# Patient Record
Sex: Female | Born: 1985 | Race: White | Hispanic: No | Marital: Single | Smoking: Current every day smoker
Health system: Southern US, Community
[De-identification: ages and names within clinical notes are randomized; demographics above are authoritative.]

## PROBLEM LIST (undated history)

## (undated) DIAGNOSIS — S36039A Unspecified laceration of spleen, initial encounter: Secondary | ICD-10-CM

## (undated) DIAGNOSIS — E876 Hypokalemia: Secondary | ICD-10-CM

## (undated) DIAGNOSIS — D649 Anemia, unspecified: Secondary | ICD-10-CM

## (undated) DIAGNOSIS — F419 Anxiety disorder, unspecified: Secondary | ICD-10-CM

## (undated) DIAGNOSIS — S270XXA Traumatic pneumothorax, initial encounter: Secondary | ICD-10-CM

## (undated) DIAGNOSIS — M549 Dorsalgia, unspecified: Secondary | ICD-10-CM

## (undated) DIAGNOSIS — Z5189 Encounter for other specified aftercare: Secondary | ICD-10-CM

## (undated) DIAGNOSIS — S36113A Laceration of liver, unspecified degree, initial encounter: Secondary | ICD-10-CM

## (undated) HISTORY — DX: Anemia, unspecified: D64.9

## (undated) HISTORY — PX: EAR TUBE REMOVAL: SHX1486

## (undated) HISTORY — DX: Encounter for other specified aftercare: Z51.89

## (undated) HISTORY — DX: Hypokalemia: E87.6

## (undated) HISTORY — PX: WISDOM TOOTH EXTRACTION: SHX21

## (undated) HISTORY — PX: ADENOIDECTOMY: SUR15

## (undated) HISTORY — PX: TONSILLECTOMY: SUR1361

---
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
## 2003-06-18 ENCOUNTER — Other Ambulatory Visit: Admission: RE | Admit: 2003-06-18 | Discharge: 2003-06-18 | Payer: Self-pay | Admitting: Family Medicine

## 2003-06-24 ENCOUNTER — Ambulatory Visit (HOSPITAL_COMMUNITY): Admission: RE | Admit: 2003-06-24 | Discharge: 2003-06-24 | Payer: Self-pay | Admitting: Family Medicine

## 2003-08-19 ENCOUNTER — Ambulatory Visit (HOSPITAL_COMMUNITY): Admission: RE | Admit: 2003-08-19 | Discharge: 2003-08-19 | Payer: Self-pay | Admitting: Family Medicine

## 2003-11-27 ENCOUNTER — Ambulatory Visit (HOSPITAL_COMMUNITY): Admission: RE | Admit: 2003-11-27 | Discharge: 2003-11-27 | Payer: Self-pay | Admitting: Family Medicine

## 2003-12-09 ENCOUNTER — Other Ambulatory Visit: Admission: RE | Admit: 2003-12-09 | Discharge: 2003-12-09 | Payer: Self-pay | Admitting: Family Medicine

## 2003-12-30 ENCOUNTER — Inpatient Hospital Stay (HOSPITAL_COMMUNITY): Admission: AD | Admit: 2003-12-30 | Discharge: 2004-01-02 | Payer: Self-pay | Admitting: Family Medicine

## 2003-12-30 ENCOUNTER — Ambulatory Visit: Payer: Self-pay | Admitting: Obstetrics and Gynecology

## 2004-02-24 ENCOUNTER — Other Ambulatory Visit: Admission: RE | Admit: 2004-02-24 | Discharge: 2004-02-24 | Payer: Self-pay | Admitting: Family Medicine

## 2008-12-10 ENCOUNTER — Emergency Department (HOSPITAL_COMMUNITY): Admission: EM | Admit: 2008-12-10 | Discharge: 2008-12-10 | Payer: Self-pay | Admitting: Emergency Medicine

## 2009-01-07 ENCOUNTER — Emergency Department (HOSPITAL_COMMUNITY): Admission: EM | Admit: 2009-01-07 | Discharge: 2009-01-07 | Payer: Self-pay | Admitting: Emergency Medicine

## 2009-04-03 ENCOUNTER — Emergency Department (HOSPITAL_COMMUNITY): Admission: EM | Admit: 2009-04-03 | Discharge: 2009-04-03 | Payer: Self-pay | Admitting: Emergency Medicine

## 2009-05-27 ENCOUNTER — Other Ambulatory Visit: Admission: RE | Admit: 2009-05-27 | Discharge: 2009-05-27 | Payer: Self-pay | Admitting: Obstetrics and Gynecology

## 2009-07-12 ENCOUNTER — Emergency Department (HOSPITAL_COMMUNITY): Admission: EM | Admit: 2009-07-12 | Discharge: 2009-07-12 | Payer: Self-pay | Admitting: Emergency Medicine

## 2009-10-10 ENCOUNTER — Ambulatory Visit: Payer: Self-pay | Admitting: Family Medicine

## 2009-10-10 ENCOUNTER — Inpatient Hospital Stay (HOSPITAL_COMMUNITY): Admission: RE | Admit: 2009-10-10 | Discharge: 2009-10-12 | Payer: Self-pay | Admitting: Obstetrics & Gynecology

## 2010-06-14 ENCOUNTER — Encounter: Payer: Self-pay | Admitting: Family Medicine

## 2010-08-10 LAB — CBC
HCT: 32.4 % — ABNORMAL LOW (ref 36.0–46.0)
HCT: 37.5 % (ref 36.0–46.0)
Hemoglobin: 13.1 g/dL (ref 12.0–15.0)
MCHC: 34.5 g/dL (ref 30.0–36.0)
MCHC: 34.9 g/dL (ref 30.0–36.0)
MCV: 93.7 fL (ref 78.0–100.0)
RBC: 3.46 MIL/uL — ABNORMAL LOW (ref 3.87–5.11)
RBC: 4.05 MIL/uL (ref 3.87–5.11)
WBC: 15.7 10*3/uL — ABNORMAL HIGH (ref 4.0–10.5)

## 2010-10-09 NOTE — Op Note (Signed)
NAME:  Patricia Jefferson, Patricia Jefferson                         ACCOUNT NO.:  [id number]   MEDICAL RECORD NO.:  [id number]                   PATIENT TYPE:  INP   LOCATION:  9163                                 FACILITY:  [facility]   PHYSICIAN:  Phil D. Okey Dupre, M.D.                  DATE OF BIRTH:  05/30/1985   DATE OF PROCEDURE:  12/30/2003  DATE OF DISCHARGE:                                 OPERATIVE REPORT   PREOPERATIVE DIAGNOSIS:  Maternal exhaustion after three hours of pushing,  second stage.   POSTOPERATIVE DIAGNOSIS:  Left occiput posterior presentation.   PROCEDURE:  Forceps delivery with vertex rotation.   OBSTETRICIAN:  Phil D. Okey Dupre, M.D.   ASSISTANT:  Magnus Sinning. Rice, M.D.   ANESTHESIA:  Epidural.   ESTIMATED BLOOD LOSS:  600 mL.   OPERATIVE FINDINGS:  I was called to see the patient by Dr. Dimple Casey because of  a posterior arrest of the patient with the baby at a +3 presentation and LOP  position.  The baby had had some variable decelerations during the night and  a three-hour second stage of labor.  During examination I was able to rotate  the baby digitally to an LOT presentation, at which point the Luikart-McLean  forceps were put on.  The anterior blade wandered over the baby's face to a  position just under the bladder.  The second bladder was put out from the  patient's left and placed over the right side of the face.  Once this was  done, the baby was rotated to an LOA presentation and with the __________  extraction, the baby was very easily delivered over a midline episiotomy  that was carried down to the __________ muscle.  As the baby's head was  crowning, the forceps were removed.  The baby had a nuchal cord, which was  manually reduced, and the baby easily delivered then by Dr. Dimple Casey, cord  doubly clamped, divided, the baby taken to the [facility].  Cord samples were  taken of blood for analysis and the placenta spontaneously removed.  Vaginal  exam revealed the midline episiotomy  with no significant extension and a  small right sulcus tear of approximately 2 x 1.5 cm, which was controlled  with a 2-0 Vicryl figure-of-eight suture.  At that point the episiotomy  using the same suture was repaired in the usual method.  The baby will be  evaluated by the nurse.                                               Phil D. Okey Dupre, M.D.    PDR/MEDQ  D:  12/31/2003  T:  12/31/2003  Job:  [id number]

## 2010-10-09 NOTE — Op Note (Signed)
NAME:  Patricia Jefferson, Patricia Jefferson                         ACCOUNT NO.:  [id number]   MEDICAL RECORD NO.:  [id number]                   PATIENT TYPE:  INP   LOCATION:  9163                                 [facility]ILITY[facility]   PHYSICIAN:  Phil D. Okey Dupre, M.D.                  DATE OF BIRTH:  [birth year]   DATE OF PROCEDURE:  12/31/2003  DATE OF DISCHARGE:                                 OPERATIVE REPORT   DELIVERY NOTE:  Forceps delivery with a vertex rotation.   DICTATION ENDED HERE.                                               Phil D. Okey Dupre, M.D.    PDR/MEDQ  D:  12/31/2003  T:  12/31/2003  Job:  [id number]

## 2014-05-07 ENCOUNTER — Emergency Department (HOSPITAL_COMMUNITY): Payer: BC Managed Care – PPO

## 2014-05-07 ENCOUNTER — Emergency Department (HOSPITAL_COMMUNITY)
Admission: EM | Admit: 2014-05-07 | Discharge: 2014-05-07 | Disposition: A | Discharge status: Home / Self Care | Payer: BC Managed Care – PPO | Attending: Emergency Medicine | Admitting: Emergency Medicine

## 2014-05-07 ENCOUNTER — Encounter (HOSPITAL_COMMUNITY): Payer: Self-pay | Admitting: Emergency Medicine

## 2014-05-07 DIAGNOSIS — J069 Acute upper respiratory infection, unspecified: Secondary | ICD-10-CM | POA: Insufficient documentation

## 2014-05-07 DIAGNOSIS — Z8659 Personal history of other mental and behavioral disorders: Secondary | ICD-10-CM | POA: Insufficient documentation

## 2014-05-07 DIAGNOSIS — Z720 Tobacco use: Secondary | ICD-10-CM | POA: Diagnosis not present

## 2014-05-07 DIAGNOSIS — R05 Cough: Secondary | ICD-10-CM

## 2014-05-07 DIAGNOSIS — R0981 Nasal congestion: Secondary | ICD-10-CM | POA: Diagnosis present

## 2014-05-07 DIAGNOSIS — R059 Cough, unspecified: Secondary | ICD-10-CM

## 2014-05-07 HISTORY — DX: Anxiety disorder, unspecified: F41.9

## 2014-05-07 HISTORY — DX: Dorsalgia, unspecified: M54.9

## 2014-05-07 MED ORDER — BENZONATATE 100 MG PO CAPS: 100.0000 mg | ORAL_CAPSULE | Freq: Three times a day (TID) | ORAL | Status: DC

## 2014-05-07 MED ORDER — HYDROCODONE-ACETAMINOPHEN 7.5-325 MG/15ML PO SOLN: 15.0000 mL | Freq: Three times a day (TID) | ORAL | Status: DC | PRN

## 2014-05-07 NOTE — Discharge Instructions (Signed)
Please call your doctor for a followup appointment within 24-48 hours. When you talk to your doctor please let them know that you were seen in the emergency department and have them acquire all of your records so that they can discuss the findings with you and formulate a treatment plan to fully care for your new and ongoing problems. ° °[facility] Doctor List ° ° ° °Edward Hawkins MD. Specialty: Pulmonary Disease Contact information: [address]  °[address]  °[place] [address]  °[phone]  ° °Margaret Simpson, MD. Specialty: Family Medicine Contact information: [address]  °[place] [address]  °[phone]  ° °Scott Luking, MD. Specialty: Family Medicine Contact information: [address]  °[address]  °[place] [address]  °[phone]  ° °Tesfaye Fanta, MD Specialty: Internal Medicine Contact information: [address]  °[place] [address]  °[phone]  ° °Zach Hall, MD. Specialty: Internal Medicine Contact information: [address]  °[place] [address]  °[phone]  ° °Angus Mcinnis, MD. Specialty: Family Medicine Contact information: [address]  °[place] [address]  °[phone]  ° °Stephen Knowlton, MD. Specialty: Family Medicine Contact information: [address]  °[address]  °[place] [address]  °[phone]  ° °Roy Fagan, MD. Specialty: Internal Medicine Contact information: [address]  °[address]  °[place] [address]  °[phone]  ° ° °

## 2014-05-07 NOTE — ED Provider Notes (Signed)
CSN: [id number]     Arrival date & time 05/07/14  [id number] History  This chart was scribed for Vida RollerBrian D Zan Triska, MD by Tonye RoyaltyJoshua Chen, ED Scribe. This patient was seen in room APA10/APA10 and the patient's care was started at 9:41 AM.   Chief Complaint  Patient presents with  . URI    The history is provided by the patient. No language interpreter was used.    Symptoms were gradual in onset Symptoms are persistent Symptoms are improved with Robitussin, Sudafed Made worse with nothing Associated symptoms include jaw pain, ear pain    HPI Comments: Patricia Jefferson is a 28 y.o. female who is a smoker who presents to the Emergency Department complaining of congestion, sore throat, and a dry cough with onset 3 days ago. Symptoms have gradually worsened. She reports associated jaw pain and ear pain. She states her cough does is dry but feels like there is "phlegm rattling around." Patient has taken sudafed and Robitussin.   Past Medical History  Diagnosis Date  . Anxiety   . Back pain    Past Surgical History  Procedure Laterality Date  . Tonsillectomy    . Adenoidectomy    . Ear tube removal     No family history on file. History  Substance Use Topics  . Smoking status: Current Every Day Smoker -- 0.50 packs/day    Types: Cigarettes  . Smokeless tobacco: Not on file  . Alcohol Use: Yes     Comment: socially   OB History    No data available     Review of Systems  HENT: Positive for congestion, ear pain and sore throat.   Respiratory: Positive for cough.       Allergies  Review of patient's allergies indicates no known allergies.  Home Medications   Prior to Admission medications   Medication Sig Start Date End Date Taking? Authorizing Provider  benzonatate (TESSALON) 100 MG capsule Take 1 capsule (100 mg total) by mouth every 8 (eight) hours. 05/07/14   Vida RollerBrian D Karynn Deblasi, MD  HYDROcodone-acetaminophen (HYCET) 7.5-325 mg/15 ml solution Take 15 mLs by mouth every 8 (eight)  hours as needed for moderate pain. 05/07/14   Vida RollerBrian D Elihue Ebert, MD   BP 112/73 mmHg  Pulse 92  Temp(Src) 98 F (36.7 C) (Oral)  Resp 18  Ht 5\' 7"  (1.702 m)  Wt 120 lb (54.432 kg)  BMI 18.79 kg/m2  SpO2 98%  LMP 04/13/2014 Physical Exam  Constitutional: She appears well-developed and well-nourished. No distress.  HENT:  Head: Normocephalic and atraumatic.  Mouth/Throat: Oropharynx is clear and moist. No oropharyngeal exudate.  Tonsils surgically absent  Tympanic membranes are normal   Eyes: Conjunctivae and EOM are normal. Pupils are equal, round, and reactive to light. Right eye exhibits no discharge. Left eye exhibits no discharge. No scleral icterus.  Neck: Normal range of motion. Neck supple. No JVD present. No thyromegaly present.  Cardiovascular: Normal rate, regular rhythm, normal heart sounds and intact distal pulses.  Exam reveals no gallop and no friction rub.   No murmur heard. Tachycardic at 105  Pulmonary/Chest: Effort normal and breath sounds normal. No respiratory distress. She has no wheezes. She has no rales.  No increased work of breathing  Abdominal: Soft. Bowel sounds are normal. She exhibits no distension and no mass. There is no tenderness.  Musculoskeletal: Normal range of motion. She exhibits no edema or tenderness.  Lymphadenopathy:    She has cervical adenopathy (Lymph of anterior cervcal chain ).  Neurological: She is alert. Coordination normal.  Skin: Skin is warm and dry. No rash noted. No erythema.  Psychiatric: She has a normal mood and affect. Her behavior is normal.  Nursing note and vitals reviewed.   ED Course  Procedures (including critical care time)  DIAGNOSTIC STUDIES: Oxygen Saturation is 100% on room air, normal by my interpretation.    COORDINATION OF CARE: 9:44 AM Discussed treatment plan with patient at beside, the patient agrees with the plan and has no further questions at this time.   Labs Review Labs Reviewed - No data to  display  Imaging Review Dg Chest 2 View  05/07/2014   CLINICAL DATA:  Cough, shortness of breath starting Friday  EXAM: CHEST  2 VIEW  COMPARISON:  None.  FINDINGS: Cardiomediastinal silhouette is unremarkable. No acute infiltrate or pleural effusion. No pulmonary edema. Bilateral nipple metallic artifacts. Bony thorax is unremarkable.  IMPRESSION: No active cardiopulmonary disease.   Electronically Signed   By: Natasha MeadLiviu  Pop M.D.   On: 05/07/2014 09:00      MDM   Final diagnoses:  URI (upper respiratory infection)    The pt is well appearing, dry bronchitic cough with normal VS on d/c.  She has no hypoxia and no signs of strep, xray as ordered by nursing shows no signs of infiltrate.  Meds given in ED:  Medications - No data to display  Discharge Medication List as of 05/07/2014  9:50 AM    START taking these medications   Details  benzonatate (TESSALON) 100 MG capsule Take 1 capsule (100 mg total) by mouth every 8 (eight) hours., Starting 05/07/2014, Until Discontinued, Print    HYDROcodone-acetaminophen (HYCET) 7.5-325 mg/15 ml solution Take 15 mLs by mouth every 8 (eight) hours as needed for moderate pain., Starting 05/07/2014, Until Discontinued, Print         I personally performed the services described in this documentation, which was scribed in my presence. The recorded information has been reviewed and is accurate.    Vida RollerBrian D Tamila Gaulin, MD 05/07/14 [phone]

## 2014-05-07 NOTE — ED Notes (Signed)
PT c/o nasal congestion, sore throat and dry cough x3 days. PT denies any fever or SOB.

## 2016-02-16 ENCOUNTER — Encounter (HOSPITAL_COMMUNITY): Payer: Self-pay | Admitting: Emergency Medicine

## 2016-02-16 ENCOUNTER — Emergency Department (HOSPITAL_COMMUNITY): Payer: BLUE CROSS/BLUE SHIELD | Admitting: Anesthesiology

## 2016-02-16 ENCOUNTER — Emergency Department (HOSPITAL_COMMUNITY): Payer: BLUE CROSS/BLUE SHIELD

## 2016-02-16 ENCOUNTER — Encounter (HOSPITAL_COMMUNITY): Admission: EM | Disposition: A | Discharge status: Home / Self Care | Payer: Self-pay | Source: Home / Self Care

## 2016-02-16 ENCOUNTER — Inpatient Hospital Stay (HOSPITAL_COMMUNITY): Payer: BLUE CROSS/BLUE SHIELD

## 2016-02-16 ENCOUNTER — Inpatient Hospital Stay (HOSPITAL_COMMUNITY)
Admission: EM | Admit: 2016-02-16 | Discharge: 2016-02-23 | DRG: 957 | Disposition: A | Discharge status: Home / Self Care | Payer: BLUE CROSS/BLUE SHIELD | Attending: General Surgery | Admitting: General Surgery

## 2016-02-16 DIAGNOSIS — S299XXA Unspecified injury of thorax, initial encounter: Secondary | ICD-10-CM

## 2016-02-16 DIAGNOSIS — R413 Other amnesia: Secondary | ICD-10-CM | POA: Diagnosis present

## 2016-02-16 DIAGNOSIS — J181 Lobar pneumonia, unspecified organism: Secondary | ICD-10-CM

## 2016-02-16 DIAGNOSIS — S060X9A Concussion with loss of consciousness of unspecified duration, initial encounter: Secondary | ICD-10-CM | POA: Diagnosis present

## 2016-02-16 DIAGNOSIS — R578 Other shock: Secondary | ICD-10-CM | POA: Diagnosis present

## 2016-02-16 DIAGNOSIS — F17210 Nicotine dependence, cigarettes, uncomplicated: Secondary | ICD-10-CM | POA: Diagnosis present

## 2016-02-16 DIAGNOSIS — F419 Anxiety disorder, unspecified: Secondary | ICD-10-CM | POA: Diagnosis present

## 2016-02-16 DIAGNOSIS — R6889 Other general symptoms and signs: Secondary | ICD-10-CM

## 2016-02-16 DIAGNOSIS — M549 Dorsalgia, unspecified: Secondary | ICD-10-CM | POA: Diagnosis present

## 2016-02-16 DIAGNOSIS — R000 Tachycardia, unspecified: Secondary | ICD-10-CM | POA: Diagnosis not present

## 2016-02-16 DIAGNOSIS — S27322A Contusion of lung, bilateral, initial encounter: Secondary | ICD-10-CM | POA: Diagnosis present

## 2016-02-16 DIAGNOSIS — Z9689 Presence of other specified functional implants: Secondary | ICD-10-CM

## 2016-02-16 DIAGNOSIS — S36113A Laceration of liver, unspecified degree, initial encounter: Secondary | ICD-10-CM | POA: Diagnosis present

## 2016-02-16 DIAGNOSIS — R0902 Hypoxemia: Secondary | ICD-10-CM

## 2016-02-16 DIAGNOSIS — K661 Hemoperitoneum: Secondary | ICD-10-CM | POA: Diagnosis present

## 2016-02-16 DIAGNOSIS — R0781 Pleurodynia: Secondary | ICD-10-CM

## 2016-02-16 DIAGNOSIS — S36039A Unspecified laceration of spleen, initial encounter: Secondary | ICD-10-CM

## 2016-02-16 DIAGNOSIS — E876 Hypokalemia: Secondary | ICD-10-CM | POA: Diagnosis present

## 2016-02-16 DIAGNOSIS — D62 Acute posthemorrhagic anemia: Secondary | ICD-10-CM | POA: Diagnosis present

## 2016-02-16 DIAGNOSIS — Z7409 Other reduced mobility: Secondary | ICD-10-CM

## 2016-02-16 DIAGNOSIS — J9691 Respiratory failure, unspecified with hypoxia: Secondary | ICD-10-CM | POA: Diagnosis not present

## 2016-02-16 DIAGNOSIS — R262 Difficulty in walking, not elsewhere classified: Secondary | ICD-10-CM

## 2016-02-16 DIAGNOSIS — E872 Acidosis, unspecified: Secondary | ICD-10-CM

## 2016-02-16 DIAGNOSIS — Z23 Encounter for immunization: Secondary | ICD-10-CM

## 2016-02-16 DIAGNOSIS — S3609XA Other injury of spleen, initial encounter: Principal | ICD-10-CM | POA: Diagnosis present

## 2016-02-16 DIAGNOSIS — S270XXA Traumatic pneumothorax, initial encounter: Secondary | ICD-10-CM | POA: Diagnosis present

## 2016-02-16 DIAGNOSIS — Z01818 Encounter for other preprocedural examination: Secondary | ICD-10-CM

## 2016-02-16 DIAGNOSIS — S36114A Minor laceration of liver, initial encounter: Secondary | ICD-10-CM

## 2016-02-16 DIAGNOSIS — S272XXA Traumatic hemopneumothorax, initial encounter: Secondary | ICD-10-CM

## 2016-02-16 DIAGNOSIS — J939 Pneumothorax, unspecified: Secondary | ICD-10-CM

## 2016-02-16 HISTORY — DX: Unspecified laceration of spleen, initial encounter: S36.039A

## 2016-02-16 HISTORY — PX: SPLENECTOMY, TOTAL: SHX788

## 2016-02-16 HISTORY — PX: CHEST TUBE INSERTION: SHX231

## 2016-02-16 HISTORY — PX: CHOLECYSTECTOMY: SHX55

## 2016-02-16 HISTORY — PX: LAPAROTOMY: SHX154

## 2016-02-16 LAB — BASIC METABOLIC PANEL
Anion gap: 8 (ref 5–15)
BUN: 6 mg/dL (ref 6–20)
CALCIUM: 6.8 mg/dL — AB (ref 8.9–10.3)
CHLORIDE: 109 mmol/L (ref 101–111)
CO2: 23 mmol/L (ref 22–32)
CREATININE: 0.6 mg/dL (ref 0.44–1.00)
GFR calc Af Amer: >60 mL/min (ref >60)
GFR calc non Af Amer: >60 mL/min (ref >60)
Glucose, Bld: 118 mg/dL — ABNORMAL HIGH (ref 65–99)
Potassium: 3.3 mmol/L — ABNORMAL LOW (ref 3.5–5.1)
SODIUM: 140 mmol/L (ref 135–145)

## 2016-02-16 LAB — CBC
HEMATOCRIT: 25.5 % — AB (ref 36.0–46.0)
HEMATOCRIT: 33.4 % — AB (ref 36.0–46.0)
HEMOGLOBIN: 11.2 g/dL — AB (ref 12.0–15.0)
Hemoglobin: 8.8 g/dL — ABNORMAL LOW (ref 12.0–15.0)
MCH: 33.6 pg (ref 26.0–34.0)
MCH: 31.5 pg (ref 26.0–34.0)
MCHC: 34.5 g/dL (ref 30.0–36.0)
MCHC: 33.5 g/dL (ref 30.0–36.0)
MCV: 97.3 fL (ref 78.0–100.0)
MCV: 94.1 fL (ref 78.0–100.0)
PLATELETS: 171 10*3/uL (ref 150–400)
Platelets: 123 10*3/uL — ABNORMAL LOW (ref 150–400)
RBC: 2.62 MIL/uL — ABNORMAL LOW (ref 3.87–5.11)
RBC: 3.55 MIL/uL — AB (ref 3.87–5.11)
RDW: 12.6 % (ref 11.5–15.5)
RDW: 15 % (ref 11.5–15.5)
WBC: 30.7 10*3/uL — AB (ref 4.0–10.5)
WBC: 22.4 10*3/uL — ABNORMAL HIGH (ref 4.0–10.5)

## 2016-02-16 LAB — COMPREHENSIVE METABOLIC PANEL
ALBUMIN: 2.7 g/dL — AB (ref 3.5–5.0)
ALT: 58 U/L — ABNORMAL HIGH (ref 14–54)
AST: 83 U/L — AB (ref 15–41)
Alkaline Phosphatase: 40 U/L (ref 38–126)
Anion gap: 7 (ref 5–15)
BUN: 6 mg/dL (ref 6–20)
CHLORIDE: 113 mmol/L — AB (ref 101–111)
CO2: 21 mmol/L — AB (ref 22–32)
Calcium: 6.5 mg/dL — ABNORMAL LOW (ref 8.9–10.3)
Creatinine, Ser: 0.62 mg/dL (ref 0.44–1.00)
GFR calc Af Amer: >60 mL/min (ref >60)
GFR calc non Af Amer: >60 mL/min (ref >60)
GLUCOSE: 124 mg/dL — AB (ref 65–99)
POTASSIUM: 3.1 mmol/L — AB (ref 3.5–5.1)
SODIUM: 141 mmol/L (ref 135–145)
Total Bilirubin: 0.4 mg/dL (ref 0.3–1.2)
Total Protein: 4.3 g/dL — ABNORMAL LOW (ref 6.5–8.1)

## 2016-02-16 LAB — PREPARE RBC (CROSSMATCH)

## 2016-02-16 LAB — URINALYSIS, ROUTINE W REFLEX MICROSCOPIC
BILIRUBIN URINE: NEGATIVE
Glucose, UA: NEGATIVE mg/dL
KETONES UR: NEGATIVE mg/dL
Leukocytes, UA: NEGATIVE
NITRITE: NEGATIVE
PH: 6 (ref 5.0–8.0)
Protein, ur: NEGATIVE mg/dL
SPECIFIC GRAVITY, URINE: 1.01 (ref 1.005–1.030)

## 2016-02-16 LAB — ABO/RH: ABO/RH(D): O POS

## 2016-02-16 LAB — I-STAT CHEM 8, ED
BUN: 4 mg/dL — AB (ref 6–20)
Calcium, Ion: 0.93 mmol/L — ABNORMAL LOW (ref 1.15–1.40)
Chloride: 107 mmol/L (ref 101–111)
Creatinine, Ser: 0.7 mg/dL (ref 0.44–1.00)
Glucose, Bld: 125 mg/dL — ABNORMAL HIGH (ref 65–99)
HEMATOCRIT: 25 % — AB (ref 36.0–46.0)
HEMOGLOBIN: 8.5 g/dL — AB (ref 12.0–15.0)
Potassium: 3.1 mmol/L — ABNORMAL LOW (ref 3.5–5.1)
SODIUM: 145 mmol/L (ref 135–145)
TCO2: 21 mmol/L (ref 0–100)

## 2016-02-16 LAB — I-STAT CG4 LACTIC ACID, ED: LACTIC ACID, VENOUS: 3.57 mmol/L — AB (ref 0.5–1.9)

## 2016-02-16 LAB — I-STAT BETA HCG BLOOD, ED (MC, WL, AP ONLY): I-stat hCG, quantitative: <5 m[IU]/mL (ref <5)

## 2016-02-16 LAB — PROTIME-INR
INR: 1.47
Prothrombin Time: 18 seconds — ABNORMAL HIGH (ref 11.4–15.2)

## 2016-02-16 LAB — SAMPLE TO BLOOD BANK

## 2016-02-16 LAB — URINE MICROSCOPIC-ADD ON

## 2016-02-16 LAB — ETHANOL: Alcohol, Ethyl (B): 57 mg/dL — ABNORMAL HIGH (ref <5)

## 2016-02-16 SURGERY — SPLENECTOMY
Anesthesia: General | Site: Chest

## 2016-02-16 MED ORDER — SODIUM CHLORIDE 0.9 % IV SOLN
INTRAVENOUS | Status: DC | PRN
  Administered 2016-02-16: 11:00:00 via INTRAVENOUS

## 2016-02-16 MED ORDER — FENTANYL CITRATE (PF) 100 MCG/2ML IJ SOLN
50.0000 ug | Freq: Once | INTRAMUSCULAR | Status: AC
  Administered 2016-02-16: 50 ug via INTRAVENOUS
  Filled 2016-02-16: qty 2

## 2016-02-16 MED ORDER — SODIUM CHLORIDE 0.9% FLUSH: 9.0000 mL | INTRAVENOUS | Status: DC | PRN

## 2016-02-16 MED ORDER — HYDROMORPHONE HCL 1 MG/ML IJ SOLN
INTRAMUSCULAR | Status: AC
  Administered 2016-02-16: 0.5 mg via INTRAVENOUS
  Filled 2016-02-16: qty 1

## 2016-02-16 MED ORDER — PANTOPRAZOLE SODIUM 40 MG PO TBEC
40.0000 mg | DELAYED_RELEASE_TABLET | Freq: Two times a day (BID) | ORAL | Status: DC
  Administered 2016-02-18 – 2016-02-23 (×5): 40 mg via ORAL
  Filled 2016-02-16 (×11): qty 1

## 2016-02-16 MED ORDER — DIPHENHYDRAMINE HCL 12.5 MG/5ML PO ELIX: 12.5000 mg | ORAL_SOLUTION | Freq: Four times a day (QID) | ORAL | Status: DC | PRN

## 2016-02-16 MED ORDER — ONDANSETRON HCL 4 MG/2ML IJ SOLN: 4.0000 mg | Freq: Four times a day (QID) | INTRAMUSCULAR | Status: DC | PRN

## 2016-02-16 MED ORDER — GLYCOPYRROLATE 0.2 MG/ML IJ SOLN
INTRAMUSCULAR | Status: DC | PRN
  Administered 2016-02-16: .6 mg via INTRAVENOUS

## 2016-02-16 MED ORDER — POTASSIUM CHLORIDE IN NACL 20-0.45 MEQ/L-% IV SOLN
INTRAVENOUS | Status: DC
  Administered 2016-02-16: 22:00:00 via INTRAVENOUS
  Administered 2016-02-17: 1000 mL via INTRAVENOUS
  Administered 2016-02-18: 18:00:00 via INTRAVENOUS
  Administered 2016-02-19: 1000 mL via INTRAVENOUS
  Administered 2016-02-21 – 2016-02-22 (×3): via INTRAVENOUS
  Filled 2016-02-16 (×15): qty 1000

## 2016-02-16 MED ORDER — ONDANSETRON HCL 4 MG/2ML IJ SOLN
INTRAMUSCULAR | Status: DC | PRN
  Administered 2016-02-16: 4 mg via INTRAVENOUS

## 2016-02-16 MED ORDER — FENTANYL CITRATE (PF) 100 MCG/2ML IJ SOLN
25.0000 ug | Freq: Once | INTRAMUSCULAR | Status: AC
  Administered 2016-02-16: 25 ug via INTRAVENOUS
  Filled 2016-02-16: qty 2

## 2016-02-16 MED ORDER — PROMETHAZINE HCL 25 MG/ML IJ SOLN: 6.2500 mg | INTRAMUSCULAR | Status: DC | PRN

## 2016-02-16 MED ORDER — LIDOCAINE HCL (CARDIAC) 20 MG/ML IV SOLN
INTRAVENOUS | Status: DC | PRN
  Administered 2016-02-16: 80 mg via INTRAVENOUS

## 2016-02-16 MED ORDER — 0.9 % SODIUM CHLORIDE (POUR BTL) OPTIME
TOPICAL | Status: DC | PRN
  Administered 2016-02-16: 5000 mL

## 2016-02-16 MED ORDER — NALOXONE HCL 0.4 MG/ML IJ SOLN: 0.4000 mg | INTRAMUSCULAR | Status: DC | PRN

## 2016-02-16 MED ORDER — FENTANYL CITRATE (PF) 100 MCG/2ML IJ SOLN
50.0000 ug | Freq: Once | INTRAMUSCULAR | Status: AC
  Administered 2016-02-16: 50 ug via INTRAVENOUS

## 2016-02-16 MED ORDER — SUCCINYLCHOLINE CHLORIDE 20 MG/ML IJ SOLN
INTRAMUSCULAR | Status: DC | PRN
  Administered 2016-02-16: 60 mg via INTRAVENOUS

## 2016-02-16 MED ORDER — MORPHINE SULFATE 2 MG/ML IV SOLN
INTRAVENOUS | Status: AC
  Filled 2016-02-16: qty 25

## 2016-02-16 MED ORDER — ROCURONIUM BROMIDE 100 MG/10ML IV SOLN
INTRAVENOUS | Status: DC | PRN
  Administered 2016-02-16: 20 mg via INTRAVENOUS
  Administered 2016-02-16: 50 mg via INTRAVENOUS

## 2016-02-16 MED ORDER — FAMOTIDINE IN NACL 20-0.9 MG/50ML-% IV SOLN
20.0000 mg | Freq: Two times a day (BID) | INTRAVENOUS | Status: DC
  Administered 2016-02-16 – 2016-02-22 (×8): 20 mg via INTRAVENOUS
  Filled 2016-02-16 (×11): qty 50

## 2016-02-16 MED ORDER — SODIUM CHLORIDE 0.9 % IV SOLN
Freq: Once | INTRAVENOUS | Status: AC
  Administered 2016-02-16: 09:00:00 via INTRAVENOUS

## 2016-02-16 MED ORDER — MORPHINE SULFATE 2 MG/ML IV SOLN
INTRAVENOUS | Status: DC
  Administered 2016-02-16 (×2): via INTRAVENOUS
  Administered 2016-02-16: 12 mg via INTRAVENOUS
  Administered 2016-02-16: 17.91 mg via INTRAVENOUS
  Administered 2016-02-17: 10:00:00 via INTRAVENOUS
  Administered 2016-02-17: 33 mg via INTRAVENOUS
  Administered 2016-02-17: 19.5 mg via INTRAVENOUS
  Administered 2016-02-17: 18 mg via INTRAVENOUS
  Administered 2016-02-17: 11 mg via INTRAVENOUS
  Administered 2016-02-18: 14 mg via INTRAVENOUS
  Administered 2016-02-18: 12 mg via INTRAVENOUS
  Administered 2016-02-18: 14 mg via INTRAVENOUS
  Administered 2016-02-18: 8 mg via INTRAVENOUS
  Administered 2016-02-18: 9 mg via INTRAVENOUS
  Administered 2016-02-18: 15.93 mg via INTRAVENOUS
  Administered 2016-02-19: 11 mg via INTRAVENOUS
  Administered 2016-02-19: 16.5 mg via INTRAVENOUS
  Administered 2016-02-19: 11 mg via INTRAVENOUS
  Filled 2016-02-16 (×5): qty 25

## 2016-02-16 MED ORDER — POLYETHYLENE GLYCOL 3350 17 G PO PACK
17.0000 g | PACK | Freq: Every day | ORAL | Status: DC
  Administered 2016-02-18 – 2016-02-21 (×3): 17 g via ORAL
  Filled 2016-02-16 (×4): qty 1

## 2016-02-16 MED ORDER — ONDANSETRON HCL 4 MG/2ML IJ SOLN
4.0000 mg | Freq: Four times a day (QID) | INTRAMUSCULAR | Status: DC | PRN
Start: 2016-02-16 — End: 2016-02-23

## 2016-02-16 MED ORDER — DOCUSATE SODIUM 100 MG PO CAPS
100.0000 mg | ORAL_CAPSULE | Freq: Two times a day (BID) | ORAL | Status: DC
  Administered 2016-02-18 – 2016-02-22 (×7): 100 mg via ORAL
  Filled 2016-02-16 (×10): qty 1

## 2016-02-16 MED ORDER — IOPAMIDOL (ISOVUE-300) INJECTION 61%
INTRAVENOUS | Status: AC
  Filled 2016-02-16: qty 100

## 2016-02-16 MED ORDER — DIPHENHYDRAMINE HCL 50 MG/ML IJ SOLN: 12.5000 mg | Freq: Four times a day (QID) | INTRAMUSCULAR | Status: DC | PRN

## 2016-02-16 MED ORDER — FENTANYL CITRATE (PF) 100 MCG/2ML IJ SOLN
INTRAMUSCULAR | Status: AC
  Filled 2016-02-16: qty 4

## 2016-02-16 MED ORDER — CEFAZOLIN SODIUM 1 G IJ SOLR
INTRAMUSCULAR | Status: DC | PRN
  Administered 2016-02-16: 2 g via INTRAMUSCULAR

## 2016-02-16 MED ORDER — FENTANYL CITRATE (PF) 100 MCG/2ML IJ SOLN
INTRAMUSCULAR | Status: DC | PRN
  Administered 2016-02-16 (×2): 50 ug via INTRAVENOUS
  Administered 2016-02-16: 100 ug via INTRAVENOUS

## 2016-02-16 MED ORDER — NEOSTIGMINE METHYLSULFATE 10 MG/10ML IV SOLN
INTRAVENOUS | Status: DC | PRN
  Administered 2016-02-16: 4 mg via INTRAVENOUS

## 2016-02-16 MED ORDER — MIDAZOLAM HCL 2 MG/2ML IJ SOLN
INTRAMUSCULAR | Status: AC
  Filled 2016-02-16: qty 2

## 2016-02-16 MED ORDER — PROPOFOL 10 MG/ML IV BOLUS
INTRAVENOUS | Status: AC
  Filled 2016-02-16: qty 20

## 2016-02-16 MED ORDER — MIDAZOLAM HCL 5 MG/5ML IJ SOLN
INTRAMUSCULAR | Status: DC | PRN
  Administered 2016-02-16: 2 mg via INTRAVENOUS

## 2016-02-16 MED ORDER — ONDANSETRON HCL 4 MG PO TABS: 4.0000 mg | ORAL_TABLET | Freq: Four times a day (QID) | ORAL | Status: DC | PRN

## 2016-02-16 MED ORDER — PROPOFOL 10 MG/ML IV BOLUS
INTRAVENOUS | Status: DC | PRN
  Administered 2016-02-16: 50 mg via INTRAVENOUS
  Administered 2016-02-16: 150 mg via INTRAVENOUS

## 2016-02-16 MED ORDER — HYDROMORPHONE HCL 1 MG/ML IJ SOLN
0.2500 mg | INTRAMUSCULAR | Status: DC | PRN
  Administered 2016-02-16 (×4): 0.5 mg via INTRAVENOUS

## 2016-02-16 MED ORDER — FENTANYL CITRATE (PF) 100 MCG/2ML IJ SOLN
INTRAMUSCULAR | Status: AC
  Filled 2016-02-16: qty 2

## 2016-02-16 MED ORDER — LACTATED RINGERS IV SOLN
INTRAVENOUS | Status: DC | PRN
  Administered 2016-02-16: 12:00:00 via INTRAVENOUS

## 2016-02-16 MED FILL — Heparin Sodium (Porcine) Inj 1000 Unit/ML: INTRAMUSCULAR | Qty: 30 | Status: AC

## 2016-02-16 MED FILL — Sodium Chloride Irrigation Soln 0.9%: Qty: 3000 | Status: AC

## 2016-02-16 MED FILL — Sodium Chloride IV Soln 0.9%: INTRAVENOUS | Qty: 1000 | Status: AC

## 2016-02-16 SURGICAL SUPPLY — 61 items
APPLIER CLIP 11 MED OPEN (CLIP) ×4
APPLIER CLIP 13 LRG OPEN (CLIP)
APPLIER CLIP ROT 10 11.4 M/L (STAPLE) ×4
APR CLP LRG 13 20 CLIP (CLIP)
APR CLP MED 11 20 MLT OPN (CLIP) ×2
APR CLP MED LRG 11.4X10 (STAPLE) ×2
BLADE SURG ROTATE 9660 (MISCELLANEOUS) IMPLANT
CANISTER SUCTION 2500CC (MISCELLANEOUS) ×8 IMPLANT
CATH TROCAR 20FR (CATHETERS) ×3 IMPLANT
CHLORAPREP W/TINT 26ML (MISCELLANEOUS) ×4 IMPLANT
CLIP APPLIE 11 MED OPEN (CLIP) ×2 IMPLANT
CLIP APPLIE 13 LRG OPEN (CLIP) IMPLANT
CLIP APPLIE ROT 10 11.4 M/L (STAPLE) ×1 IMPLANT
COVER SURGICAL LIGHT HANDLE (MISCELLANEOUS) ×4 IMPLANT
DECANTER SPIKE VIAL GLASS SM (MISCELLANEOUS) IMPLANT
DRAPE LAPAROSCOPIC ABDOMINAL (DRAPES) ×4 IMPLANT
DRAPE UTILITY XL STRL (DRAPES) ×8 IMPLANT
DRSG OPSITE POSTOP 4X10 (GAUZE/BANDAGES/DRESSINGS) ×2 IMPLANT
DRSG OPSITE POSTOP 4X6 (GAUZE/BANDAGES/DRESSINGS) ×2 IMPLANT
ELECT BLADE 6.5 EXT (BLADE) ×4 IMPLANT
ELECT REM PT RETURN 9FT ADLT (ELECTROSURGICAL) ×4
ELECTRODE REM PT RTRN 9FT ADLT (ELECTROSURGICAL) ×2 IMPLANT
GAUZE SPONGE 4X4 12PLY STRL (GAUZE/BANDAGES/DRESSINGS) ×3 IMPLANT
GLOVE BIO SURGEON STRL SZ 6.5 (GLOVE) ×2 IMPLANT
GLOVE BIO SURGEON STRL SZ7.5 (GLOVE) ×2 IMPLANT
GLOVE BIO SURGEON STRL SZ8 (GLOVE) ×7 IMPLANT
GLOVE BIO SURGEONS STRL SZ 6.5 (GLOVE) ×1
GLOVE BIOGEL PI IND STRL 6.5 (GLOVE) ×1 IMPLANT
GLOVE BIOGEL PI IND STRL 7.5 (GLOVE) ×1 IMPLANT
GLOVE BIOGEL PI IND STRL 8 (GLOVE) ×4 IMPLANT
GLOVE BIOGEL PI INDICATOR 6.5 (GLOVE) ×2
GLOVE BIOGEL PI INDICATOR 7.5 (GLOVE) ×2
GLOVE BIOGEL PI INDICATOR 8 (GLOVE) ×4
GOWN STRL REUS W/ TWL LRG LVL3 (GOWN DISPOSABLE) ×4 IMPLANT
GOWN STRL REUS W/ TWL XL LVL3 (GOWN DISPOSABLE) ×2 IMPLANT
GOWN STRL REUS W/TWL LRG LVL3 (GOWN DISPOSABLE) ×8
GOWN STRL REUS W/TWL XL LVL3 (GOWN DISPOSABLE) ×4
HEMOSTAT SURGICEL 2X14 (HEMOSTASIS) IMPLANT
KIT BASIN OR (CUSTOM PROCEDURE TRAY) ×4 IMPLANT
KIT ROOM TURNOVER OR (KITS) ×4 IMPLANT
NS IRRIG 1000ML POUR BTL (IV SOLUTION) ×8 IMPLANT
PACK GENERAL/GYN (CUSTOM PROCEDURE TRAY) ×4 IMPLANT
PAD ARMBOARD 7.5X6 YLW CONV (MISCELLANEOUS) ×8 IMPLANT
SPECIMEN JAR LARGE (MISCELLANEOUS) ×2 IMPLANT
SPECIMEN JAR MEDIUM (MISCELLANEOUS) ×4 IMPLANT
SPECIMEN JAR X LARGE (MISCELLANEOUS) ×4 IMPLANT
SPONGE INTESTINAL PEANUT (DISPOSABLE) ×3 IMPLANT
SPONGE LAP 18X18 X RAY DECT (DISPOSABLE) ×6 IMPLANT
SPONGE SURGIFOAM ABS GEL 100 (HEMOSTASIS) IMPLANT
STAPLER VISISTAT 35W (STAPLE) ×4 IMPLANT
SUCTION POOLE TIP (SUCTIONS) IMPLANT
SUT PDS AB 1 TP1 96 (SUTURE) ×8 IMPLANT
SUT SILK 0 TIES 10X30 (SUTURE) ×4 IMPLANT
SUT SILK 2 0 SH (SUTURE) ×8 IMPLANT
SUT SILK 2 0 TIES 10X30 (SUTURE) ×4 IMPLANT
SUT SILK 2 0SH CR/8 30 (SUTURE) ×4 IMPLANT
SYSTEM SAHARA CHEST DRAIN ATS (WOUND CARE) ×3 IMPLANT
TOWEL OR 17X24 6PK STRL BLUE (TOWEL DISPOSABLE) ×4 IMPLANT
TOWEL OR 17X26 10 PK STRL BLUE (TOWEL DISPOSABLE) ×4 IMPLANT
TRAY FOLEY CATH 14FRSI W/METER (CATHETERS) IMPLANT
WATER STERILE IRR 1000ML POUR (IV SOLUTION) ×4 IMPLANT

## 2016-02-16 NOTE — ED Notes (Signed)
Dr. Janee Mornhompson  at bedside for constent.
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
## 2016-02-16 NOTE — ED Notes (Signed)
Back from CT , pt voided again, c/o  Chest pain

## 2016-02-16 NOTE — Transfer of Care (Signed)
Immediate Anesthesia Transfer of Care Note  Patient: Patricia MansonJessica A Jefferson  Procedure(s) Performed: Procedure(s): SPLENECTOMY (N/A) CHEST TUBE INSERTION (Left) CHOLECYSTECTOMY (N/A) EXPLORATORY LAPAROTOMY (N/A)  Patient Location: PACU  Anesthesia Type:General  Level of Consciousness: awake, alert , oriented and sedated  Airway & Oxygen Therapy: Patient Spontanous Breathing and Patient connected to nasal cannula oxygen  Post-op Assessment: Report given to RN, Post -op Vital signs reviewed and stable and Patient moving all extremities  Post vital signs: Reviewed and stable  Last Vitals:  Vitals:   02/16/16 1108 02/16/16 1110  BP:    Pulse: 79 73  Resp: (!) 22 13  Temp:      Last Pain:  Vitals:   02/16/16 1045  TempSrc: Oral  PainSc:          Complications: No apparent anesthesia complications
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
## 2016-02-16 NOTE — ED Notes (Signed)
Pt continues to reports SOB and abd/chest pain.

## 2016-02-16 NOTE — Anesthesia Preprocedure Evaluation (Addendum)
Anesthesia Evaluation  Patient identified by MRN, date of birth, ID band Patient awake    Reviewed: Allergy & Precautions, NPO status , Patient's Chart, lab work & pertinent test results  History of Anesthesia Complications Negative for: history of anesthetic complications  Airway Mallampati: III  TM Distance: >3 FB Neck ROM: Full  Mouth opening: Limited Mouth Opening  Dental  (+) Dental Advisory Given, Teeth Intact   Pulmonary neg shortness of breath, neg sleep apnea, neg COPD, neg recent URI, Current Smoker,  Left pneumothorax, bilateral lung contusions   breath sounds clear to auscultation + decreased breath sounds (on left)      Cardiovascular (-) hypertension(-) angina(-) CAD, (-) Past MI, (-) Cardiac Stents and (-) CABG + dysrhythmias (prolonged QT)  Rhythm:Regular Rate:Normal     Neuro/Psych neg Seizures PSYCHIATRIC DISORDERS Anxiety Possible Chiari I malformation    GI/Hepatic negative GI ROS, Neg liver ROS,   Endo/Other  negative endocrine ROS  Renal/GU negative Renal ROS     Musculoskeletal   Abdominal   Peds  Hematology negative hematology ROS (+)   Anesthesia Other Findings   Reproductive/Obstetrics                          Anesthesia Physical Anesthesia Plan  ASA: III and emergent  Anesthesia Plan: General   Post-op Pain Management:    Induction: Intravenous  Airway Management Planned: Oral ETT and Video Laryngoscope Planned  Additional Equipment: Arterial line  Intra-op Plan:   Post-operative Plan: Extubation in OR  Informed Consent: I have reviewed the patients History and Physical, chart, labs and discussed the procedure including the risks, benefits and alternatives for the proposed anesthesia with the patient or authorized representative who has indicated his/her understanding and acceptance.   Dental advisory given  Plan Discussed with: CRNA  Anesthesia Plan  Comments: (Risks of general anesthesia discussed including, but not limited to, sore throat, hoarse voice, chipped/damaged teeth, injury to vocal cords, nausea and vomiting, allergic reactions, lung infection, heart attack, stroke, and death. All questions answered. )       Anesthesia Quick Evaluation

## 2016-02-16 NOTE — Anesthesia Procedure Notes (Signed)
Procedure Name: Intubation Date/Time: 02/16/2016 11:32 AM Performed by: Fransisca KaufmannMEYER, Greydon Betke E Pre-anesthesia Checklist: Patient identified, Emergency Drugs available, Suction available and Patient being monitored Patient Re-evaluated:Patient Re-evaluated prior to inductionOxygen Delivery Method: Circle System Utilized Preoxygenation: Pre-oxygenation with 100% oxygen Intubation Type: IV induction Ventilation: Mask ventilation without difficulty Laryngoscope Size: Glidescope Grade View: Grade I Tube type: Oral Number of attempts: 1 Airway Equipment and Method: Stylet,  Oral airway and Video-laryngoscopy Placement Confirmation: ETT inserted through vocal cords under direct vision,  positive ETCO2 and breath sounds checked- equal and bilateral Secured at: 21 cm Tube secured with: Tape Dental Injury: Teeth and Oropharynx as per pre-operative assessment  Difficulty Due To: Difficulty was anticipated, Difficult Airway- due to limited oral opening and Difficult Airway- due to dentition

## 2016-02-16 NOTE — ED Notes (Signed)
[address] into see pt

## 2016-02-16 NOTE — Anesthesia Postprocedure Evaluation (Signed)
Anesthesia Post Note  Patient: Patricia MansonJessica A Jefferson  Procedure(s) Performed: Procedure(s) (LRB): SPLENECTOMY (N/A) CHEST TUBE INSERTION (Left) CHOLECYSTECTOMY (N/A) EXPLORATORY LAPAROTOMY (N/A)  Patient location during evaluation: PACU Anesthesia Type: General Level of consciousness: awake and alert Pain management: pain level controlled Vital Signs Assessment: post-procedure vital signs reviewed and stable Respiratory status: spontaneous breathing, nonlabored ventilation, respiratory function stable and patient connected to nasal cannula oxygen Cardiovascular status: blood pressure returned to baseline and stable Postop Assessment: no signs of nausea or vomiting Anesthetic complications: no    Last Vitals:  Vitals:   02/16/16 1550 02/16/16 1605  BP: 121/76 125/82  Pulse: 61 60  Resp: (!) 21 20  Temp:  37.4 C    Last Pain:  Vitals:   02/16/16 1605  TempSrc:   PainSc: Asleep                 Linton RumpJennifer Dickerson Barlow Harrison
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
## 2016-02-17 ENCOUNTER — Inpatient Hospital Stay (HOSPITAL_COMMUNITY): Payer: BLUE CROSS/BLUE SHIELD

## 2016-02-17 ENCOUNTER — Encounter (HOSPITAL_COMMUNITY): Payer: Self-pay | Admitting: General Surgery

## 2016-02-17 LAB — COMPREHENSIVE METABOLIC PANEL
ALT: 79 U/L — ABNORMAL HIGH (ref 14–54)
ANION GAP: 7 (ref 5–15)
AST: 101 U/L — ABNORMAL HIGH (ref 15–41)
Albumin: 2.7 g/dL — ABNORMAL LOW (ref 3.5–5.0)
Alkaline Phosphatase: 51 U/L (ref 38–126)
BILIRUBIN TOTAL: 0.7 mg/dL (ref 0.3–1.2)
BUN: 6 mg/dL (ref 6–20)
CO2: 25 mmol/L (ref 22–32)
Calcium: 7 mg/dL — ABNORMAL LOW (ref 8.9–10.3)
Chloride: 109 mmol/L (ref 101–111)
Creatinine, Ser: 0.61 mg/dL (ref 0.44–1.00)
Glucose, Bld: 102 mg/dL — ABNORMAL HIGH (ref 65–99)
POTASSIUM: 3.1 mmol/L — AB (ref 3.5–5.1)
Sodium: 141 mmol/L (ref 135–145)
TOTAL PROTEIN: 4.3 g/dL — AB (ref 6.5–8.1)

## 2016-02-17 LAB — CBC
HCT: 33.1 % — ABNORMAL LOW (ref 36.0–46.0)
HEMOGLOBIN: 11.1 g/dL — AB (ref 12.0–15.0)
MCH: 32 pg (ref 26.0–34.0)
MCHC: 33.5 g/dL (ref 30.0–36.0)
MCV: 95.4 fL (ref 78.0–100.0)
Platelets: 120 10*3/uL — ABNORMAL LOW (ref 150–400)
RBC: 3.47 MIL/uL — AB (ref 3.87–5.11)
RDW: 15.6 % — ABNORMAL HIGH (ref 11.5–15.5)
WBC: 23.1 10*3/uL — AB (ref 4.0–10.5)

## 2016-02-17 LAB — PREPARE FRESH FROZEN PLASMA
UNIT DIVISION: 0
UNIT DIVISION: 0

## 2016-02-17 LAB — MRSA PCR SCREENING: MRSA BY PCR: NEGATIVE

## 2016-02-17 MED ORDER — ACETAMINOPHEN 325 MG PO TABS: 650.0000 mg | ORAL_TABLET | Freq: Four times a day (QID) | ORAL | Status: DC | PRN

## 2016-02-17 MED ORDER — POTASSIUM CHLORIDE 10 MEQ/100ML IV SOLN
10.0000 meq | INTRAVENOUS | Status: AC
  Administered 2016-02-17 (×6): 10 meq via INTRAVENOUS
  Filled 2016-02-17 (×5): qty 100

## 2016-02-17 MED ORDER — OXYCODONE HCL 5 MG PO TABS: 5.0000 mg | ORAL_TABLET | ORAL | Status: DC | PRN

## 2016-02-17 MED ORDER — ENOXAPARIN SODIUM 30 MG/0.3ML ~~LOC~~ SOLN
30.0000 mg | Freq: Two times a day (BID) | SUBCUTANEOUS | Status: DC
  Administered 2016-02-17 – 2016-02-21 (×10): 30 mg via SUBCUTANEOUS
  Filled 2016-02-17 (×12): qty 0.3
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
## 2016-02-17 NOTE — Progress Notes (Signed)
Rehab Admissions Coordinator Note:  Patient was screened by Clois DupesBoyette, Aleayah Chico Godwin for appropriateness for an Inpatient Acute Rehab Consult per OT recommendation. .  At this time, we are recommending Inpatient Rehab consult. Please place order when felt appropriate.  Clois DupesBoyette, Loralei Radcliffe Godwin 02/17/2016, 1:05 PM  I can be reached at [phone].

## 2016-02-18 ENCOUNTER — Inpatient Hospital Stay (HOSPITAL_COMMUNITY): Payer: BLUE CROSS/BLUE SHIELD

## 2016-02-18 DIAGNOSIS — S270XXA Traumatic pneumothorax, initial encounter: Secondary | ICD-10-CM | POA: Diagnosis present

## 2016-02-18 DIAGNOSIS — S36113A Laceration of liver, unspecified degree, initial encounter: Secondary | ICD-10-CM | POA: Diagnosis present

## 2016-02-18 DIAGNOSIS — E876 Hypokalemia: Secondary | ICD-10-CM | POA: Diagnosis not present

## 2016-02-18 DIAGNOSIS — S27322A Contusion of lung, bilateral, initial encounter: Secondary | ICD-10-CM | POA: Diagnosis present

## 2016-02-18 DIAGNOSIS — D62 Acute posthemorrhagic anemia: Secondary | ICD-10-CM | POA: Diagnosis present

## 2016-02-18 HISTORY — DX: Traumatic pneumothorax, initial encounter: S27.0XXA

## 2016-02-18 HISTORY — DX: Laceration of liver, unspecified degree, initial encounter: S36.113A

## 2016-02-18 LAB — POCT I-STAT 7, (LYTES, BLD GAS, ICA,H+H)
ACID-BASE DEFICIT: 2 mmol/L (ref 0.0–2.0)
Acid-base deficit: 1 mmol/L (ref 0.0–2.0)
Bicarbonate: 24.6 mmol/L (ref 20.0–28.0)
Bicarbonate: 24.6 mmol/L (ref 20.0–28.0)
CALCIUM ION: 0.97 mmol/L — AB (ref 1.15–1.40)
Calcium, Ion: 1.01 mmol/L — ABNORMAL LOW (ref 1.15–1.40)
HCT: 24 % — ABNORMAL LOW (ref 36.0–46.0)
HEMATOCRIT: 31 % — AB (ref 36.0–46.0)
HEMOGLOBIN: 10.5 g/dL — AB (ref 12.0–15.0)
HEMOGLOBIN: 8.2 g/dL — AB (ref 12.0–15.0)
O2 Saturation: 100 %
O2 Saturation: 100 %
PCO2 ART: 41.4 mmHg (ref 32.0–48.0)
PH ART: 7.337 — AB (ref 7.350–7.450)
PO2 ART: 439 mmHg — AB (ref 83.0–108.0)
Potassium: 3.3 mmol/L — ABNORMAL LOW (ref 3.5–5.1)
Potassium: 3.8 mmol/L (ref 3.5–5.1)
SODIUM: 142 mmol/L (ref 135–145)
Sodium: 143 mmol/L (ref 135–145)
TCO2: 26 mmol/L (ref 0–100)
TCO2: 26 mmol/L (ref 0–100)
pCO2 arterial: 45.5 mmHg (ref 32.0–48.0)
pH, Arterial: 7.381 (ref 7.350–7.450)
pO2, Arterial: 404 mmHg — ABNORMAL HIGH (ref 83.0–108.0)

## 2016-02-18 LAB — BASIC METABOLIC PANEL WITH GFR
Anion gap: 12 (ref 5–15)
BUN: <5 mg/dL — ABNORMAL LOW (ref 6–20)
CO2: 18 mmol/L — ABNORMAL LOW (ref 22–32)
Calcium: 6.5 mg/dL — ABNORMAL LOW (ref 8.9–10.3)
Chloride: 110 mmol/L (ref 101–111)
Creatinine, Ser: 0.49 mg/dL (ref 0.44–1.00)
GFR calc Af Amer: >60 mL/min
GFR calc non Af Amer: >60 mL/min
Glucose, Bld: 76 mg/dL (ref 65–99)
Potassium: 3 mmol/L — ABNORMAL LOW (ref 3.5–5.1)
Sodium: 140 mmol/L (ref 135–145)

## 2016-02-18 LAB — CBC
HCT: 26.6 % — ABNORMAL LOW (ref 36.0–46.0)
Hemoglobin: 9.2 g/dL — ABNORMAL LOW (ref 12.0–15.0)
MCH: 32.5 pg (ref 26.0–34.0)
MCHC: 34.6 g/dL (ref 30.0–36.0)
MCV: 94 fL (ref 78.0–100.0)
PLATELETS: 123 10*3/uL — AB (ref 150–400)
RBC: 2.83 MIL/uL — AB (ref 3.87–5.11)
RDW: 14.4 % (ref 11.5–15.5)
WBC: 25 10*3/uL — ABNORMAL HIGH (ref 4.0–10.5)

## 2016-02-18 MED ORDER — GUAIFENESIN ER 600 MG PO TB12
1200.0000 mg | ORAL_TABLET | Freq: Two times a day (BID) | ORAL | Status: DC
  Administered 2016-02-18 – 2016-02-23 (×9): 1200 mg via ORAL
  Filled 2016-02-18 (×10): qty 2

## 2016-02-18 MED ORDER — IPRATROPIUM-ALBUTEROL 0.5-2.5 (3) MG/3ML IN SOLN
3.0000 mL | Freq: Four times a day (QID) | RESPIRATORY_TRACT | Status: DC
  Administered 2016-02-18 – 2016-02-21 (×12): 3 mL via RESPIRATORY_TRACT
  Filled 2016-02-18 (×12): qty 3

## 2016-02-18 MED ORDER — ALBUTEROL SULFATE (2.5 MG/3ML) 0.083% IN NEBU
2.5000 mg | INHALATION_SOLUTION | RESPIRATORY_TRACT | Status: DC | PRN
  Administered 2016-02-22: 2.5 mg via RESPIRATORY_TRACT

## 2016-02-18 MED ORDER — IOPAMIDOL (ISOVUE-370) INJECTION 76%
INTRAVENOUS | Status: AC
Start: 2016-02-18 — End: 2016-02-18
  Administered 2016-02-18: 100 mL
  Filled 2016-02-18: qty 100

## 2016-02-18 MED ORDER — POTASSIUM CHLORIDE CRYS ER 20 MEQ PO TBCR
40.0000 meq | EXTENDED_RELEASE_TABLET | Freq: Two times a day (BID) | ORAL | Status: DC
  Administered 2016-02-18 – 2016-02-23 (×9): 40 meq via ORAL
  Filled 2016-02-18 (×9): qty 2

## 2016-02-18 NOTE — Progress Notes (Signed)
Updated by CT that patients left lung is collapsed due to mucous plugging, negative for PE.  Mild edema in mediastinal fat compatible with contusion related to chest.  Dr. Lindie SpruceWyatt notified of results.  Will continue to monitor.
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
## 2016-02-18 NOTE — Progress Notes (Signed)
Patient using flutter valve began to cough and 02 dropped into the 70's.  Patients HR increased into the 120's.  Advanced patient to a non re-breather patients 02 increased to 92%.  Respiratory therapist at bedside suggesting bipap or high flow oxygen.  [facility] paged awaiting response.  Will continue to monitor.

## 2016-02-18 NOTE — Care Management Important Message (Signed)
Important Message  Patient Details  Name: Patricia MansonJessica A Auguste MRN: [id number] Date of Birth: 1985/09/05   Medicare Important Message Given:  Yes    Kyla BalzarineShealy, Mirela Parsley Abena 02/18/2016, 10:45 AM
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
## 2016-02-18 NOTE — Progress Notes (Signed)
Updated PA Tinnie GensJeffrey on patients respiratory changes, instructed to trial patient laying flat to see if patient can tolerate CT.  Patient was able to lay flat comfortably with no s/s of distress while maintaining her oxygen in the 90's.  Nurse will accompany patient to CT awaiting transport at this time.  Will continue to monitor.

## 2016-02-18 NOTE — Progress Notes (Signed)
Patient started on Flutter device as CPT at this time due to pain and chest tube in place. RN aware.

## 2016-02-18 NOTE — Progress Notes (Signed)
Contacted by radiology patients chest xray shows significant mucous plugging in left lung with an increased pneumothorax and shifting of heart.  Contacted PA Tinnie GensJeffrey updated on patients results.  Will continue to monitor.

## 2016-02-18 NOTE — Progress Notes (Signed)
Ambulated patient to chair patients HR went into the 150's upon sitting dropped into the 110's.  Patients 02 saturation dropped to 82% titrated patient up to a venti mask at 60 percent patient currently maintaining 02 at 92%.  Contacted PA Jeffery updated on patients change in condition received an order for a chest xray.  Will continue to follow up.

## 2016-02-19 ENCOUNTER — Inpatient Hospital Stay (HOSPITAL_COMMUNITY): Payer: BLUE CROSS/BLUE SHIELD

## 2016-02-19 ENCOUNTER — Inpatient Hospital Stay (HOSPITAL_COMMUNITY): Payer: BLUE CROSS/BLUE SHIELD | Admitting: Critical Care Medicine

## 2016-02-19 LAB — BLOOD GAS, ARTERIAL
Acid-Base Excess: 0.7 mmol/L (ref 0.0–2.0)
BICARBONATE: 24.6 mmol/L (ref 20.0–28.0)
Drawn by: 270221
FIO2: 50
LHR: 14 {breaths}/min
O2 Saturation: 95.3 %
PATIENT TEMPERATURE: 98.6
PEEP: 5 cmH2O
VT: 0.5 mL
pCO2 arterial: 38.5 mmHg (ref 32.0–48.0)
pH, Arterial: 7.423 (ref 7.350–7.450)
pO2, Arterial: 74.9 mmHg — ABNORMAL LOW (ref 83.0–108.0)

## 2016-02-19 LAB — BASIC METABOLIC PANEL
ANION GAP: 12 (ref 5–15)
BUN: <5 mg/dL — ABNORMAL LOW (ref 6–20)
CALCIUM: 8.4 mg/dL — AB (ref 8.9–10.3)
CO2: 22 mmol/L (ref 22–32)
Chloride: 99 mmol/L — ABNORMAL LOW (ref 101–111)
Creatinine, Ser: 0.59 mg/dL (ref 0.44–1.00)
GLUCOSE: 97 mg/dL (ref 65–99)
Potassium: 4.4 mmol/L (ref 3.5–5.1)
Sodium: 133 mmol/L — ABNORMAL LOW (ref 135–145)

## 2016-02-19 LAB — CBC
HCT: 34.6 % — ABNORMAL LOW (ref 36.0–46.0)
HEMOGLOBIN: 11.5 g/dL — AB (ref 12.0–15.0)
MCH: 31.7 pg (ref 26.0–34.0)
MCHC: 33.2 g/dL (ref 30.0–36.0)
MCV: 95.3 fL (ref 78.0–100.0)
PLATELETS: 187 10*3/uL (ref 150–400)
RBC: 3.63 MIL/uL — AB (ref 3.87–5.11)
RDW: 13.8 % (ref 11.5–15.5)
WBC: 30.6 10*3/uL — ABNORMAL HIGH (ref 4.0–10.5)

## 2016-02-19 LAB — TRIGLYCERIDES: TRIGLYCERIDES: 106 mg/dL (ref <150)

## 2016-02-19 MED ORDER — FENTANYL CITRATE (PF) 100 MCG/2ML IJ SOLN
INTRAMUSCULAR | Status: AC
  Filled 2016-02-19: qty 2

## 2016-02-19 MED ORDER — MIDAZOLAM HCL 2 MG/2ML IJ SOLN
INTRAMUSCULAR | Status: AC
  Filled 2016-02-19: qty 2

## 2016-02-19 MED ORDER — SUCCINYLCHOLINE CHLORIDE 20 MG/ML IJ SOLN
INTRAMUSCULAR | Status: DC | PRN
  Administered 2016-02-19: 100 mg via INTRAVENOUS

## 2016-02-19 MED ORDER — ORAL CARE MOUTH RINSE
15.0000 mL | Freq: Four times a day (QID) | OROMUCOSAL | Status: DC
  Administered 2016-02-20 (×2): 15 mL via OROMUCOSAL

## 2016-02-19 MED ORDER — FENTANYL CITRATE (PF) 100 MCG/2ML IJ SOLN
50.0000 ug | Freq: Once | INTRAMUSCULAR | Status: DC
  Filled 2016-02-19: qty 2

## 2016-02-19 MED ORDER — MIDAZOLAM HCL 10 MG/2ML IJ SOLN: 4.0000 mg | INTRAMUSCULAR | Status: DC | PRN

## 2016-02-19 MED ORDER — PIPERACILLIN-TAZOBACTAM 3.375 G IVPB
3.3750 g | Freq: Three times a day (TID) | INTRAVENOUS | Status: DC
  Administered 2016-02-19 – 2016-02-23 (×11): 3.375 g via INTRAVENOUS
  Filled 2016-02-19 (×13): qty 50

## 2016-02-19 MED ORDER — MIDAZOLAM HCL 2 MG/2ML IJ SOLN
INTRAMUSCULAR | Status: AC
  Filled 2016-02-19: qty 4

## 2016-02-19 MED ORDER — PROPOFOL 10 MG/ML IV BOLUS
INTRAVENOUS | Status: DC | PRN
  Administered 2016-02-19: 120 mg via INTRAVENOUS

## 2016-02-19 MED ORDER — ACETYLCYSTEINE 20 % IN SOLN
2.0000 mL | Freq: Once | RESPIRATORY_TRACT | Status: AC
Start: 2016-02-19 — End: 2016-02-19
  Administered 2016-02-19: 2 mL via RESPIRATORY_TRACT
  Filled 2016-02-19: qty 4

## 2016-02-19 MED ORDER — MIDAZOLAM HCL 2 MG/2ML IJ SOLN: 4.0000 mg | INTRAMUSCULAR | Status: DC | PRN

## 2016-02-19 MED ORDER — VECURONIUM BROMIDE 10 MG IV SOLR
INTRAVENOUS | Status: AC
  Filled 2016-02-19: qty 10

## 2016-02-19 MED ORDER — FENTANYL CITRATE (PF) 100 MCG/2ML IJ SOLN: 100.0000 ug | INTRAMUSCULAR | Status: DC | PRN

## 2016-02-19 MED ORDER — CHLORHEXIDINE GLUCONATE 0.12% ORAL RINSE (MEDLINE KIT)
15.0000 mL | Freq: Two times a day (BID) | OROMUCOSAL | Status: DC
  Administered 2016-02-19: 15 mL via OROMUCOSAL

## 2016-02-19 MED ORDER — FENTANYL BOLUS VIA INFUSION
50.0000 ug | INTRAVENOUS | Status: DC | PRN
  Filled 2016-02-19: qty 50

## 2016-02-19 MED ORDER — MIDAZOLAM HCL 2 MG/2ML IJ SOLN
2.0000 mg | INTRAMUSCULAR | Status: DC | PRN
  Filled 2016-02-19: qty 4

## 2016-02-19 MED ORDER — SODIUM CHLORIDE 0.9 % IV SOLN
25.0000 ug/h | INTRAVENOUS | Status: DC
  Administered 2016-02-19: 100 ug/h via INTRAVENOUS
  Administered 2016-02-20: 200 ug/h via INTRAVENOUS
  Filled 2016-02-19 (×2): qty 50

## 2016-02-19 MED ORDER — PIPERACILLIN-TAZOBACTAM 3.375 G IVPB 30 MIN
3.3750 g | Freq: Once | INTRAVENOUS | Status: AC
  Administered 2016-02-19: 3.375 g via INTRAVENOUS
  Filled 2016-02-19: qty 50

## 2016-02-19 MED ORDER — LIDOCAINE HCL (CARDIAC) 20 MG/ML IV SOLN
INTRAVENOUS | Status: DC | PRN
  Administered 2016-02-19: 80 mg via INTRAVENOUS

## 2016-02-19 MED ORDER — VECURONIUM BROMIDE 10 MG IV SOLR
10.0000 mg | Freq: Once | INTRAVENOUS | Status: AC
  Administered 2016-02-19: 10 mg via INTRAVENOUS

## 2016-02-19 MED ORDER — PROPOFOL 1000 MG/100ML IV EMUL
0.0000 ug/kg/min | INTRAVENOUS | Status: DC
  Administered 2016-02-19 – 2016-02-20 (×3): 30 ug/kg/min via INTRAVENOUS
  Administered 2016-02-20: 15 ug/kg/min via INTRAVENOUS
  Filled 2016-02-19 (×4): qty 100

## 2016-02-19 MED ORDER — FENTANYL CITRATE (PF) 100 MCG/2ML IJ SOLN
200.0000 ug | Freq: Once | INTRAMUSCULAR | Status: AC
  Administered 2016-02-19: 200 ug via INTRAVENOUS

## 2016-02-19 MED ORDER — MIDAZOLAM HCL 2 MG/2ML IJ SOLN
8.0000 mg | Freq: Once | INTRAMUSCULAR | Status: AC
  Administered 2016-02-19: 8 mg via INTRAVENOUS

## 2016-02-19 NOTE — Progress Notes (Signed)
Patient to transfer to 3M09 report given to receiving nurse, all question answered at this time.

## 2016-02-19 NOTE — Progress Notes (Signed)
OT Cancellation Note  Patient Details Name: Patricia MansonJessica A Cunliffe MRN: [id number] DOB: 1985-08-30   Cancelled Treatment:    Reason Eval/Treat Not Completed: Medical issues which prohibited therapy (Pt transferred to ICU and intubated. Will follow.)  Evern BioMayberry, Lainie Daubert Lynn 02/19/2016, 11:27 AM
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
## 2016-02-19 NOTE — Progress Notes (Signed)
LLL remains consolidated.  Will admit to the ICU, intubate, perform bronchoscopy, and try to quickly extubate.  Marta LamasJames O. Gae BonWyatt, III, MD, FACS [phone] Trauma Surgeon
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
## 2016-02-19 NOTE — Procedures (Signed)
Bronchoscopy Procedure Note Patricia MansonJessica A Jefferson [id number] August 27, 1985  Procedure: Bronchoscopy Indications: Diagnostic evaluation of the airways, Obtain specimens for culture and/or other diagnostic studies and Remove secretions  Procedure Details Consent: Unable to obtain consent because of emergent medical necessity. Time Out: Verified patient identification, verified procedure, site/side was marked, verified correct patient position, special equipment/implants available, medications/allergies/relevent history reviewed, required imaging and test results available.  Performed  In preparation for procedure, patient was given 100% FiO2 and bronchoscope lubricated. Sedation: Benzodiazepines, Muscle relaxants and fentanyl  Airway entered and the following bronchi were examined: RUL, LUL, LLL and Bronchi.   Procedures performed: Brushings performed Bronchoscope removed.  , FIO2 weaned down   Evaluation Hemodynamic Status: BP stable throughout; O2 sats: stable throughout Patient's Current Condition: stable Specimens:  Sent purulent fluid Complications: No apparent complications Patient did tolerate procedure well.  CXR is pending   Patricia NormanJAMES Patricia Jefferson 02/19/2016

## 2016-02-19 NOTE — Anesthesia Procedure Notes (Signed)
Procedure Name: Intubation Date/Time: 02/19/2016 9:13 AM Performed by: Glo HerringLEE, Desarie Feild B Pre-anesthesia Checklist: Patient identified, Emergency Drugs available, Suction available, Patient being monitored and Timeout performed Patient Re-evaluated:Patient Re-evaluated prior to inductionOxygen Delivery Method: Ambu bag Preoxygenation: Pre-oxygenation with 100% oxygen Intubation Type: IV induction Ventilation: Mask ventilation without difficulty Laryngoscope Size: Mac and 3 Grade View: Grade I Tube type: Oral Tube size: 8.0 mm Number of attempts: 1 Airway Equipment and Method: Stylet Placement Confirmation: ETT inserted through vocal cords under direct vision,  positive ETCO2,  CO2 detector and breath sounds checked- equal and bilateral Secured at: 21 cm Tube secured with: Tape Dental Injury: Teeth and Oropharynx as per pre-operative assessment

## 2016-02-20 ENCOUNTER — Inpatient Hospital Stay (HOSPITAL_COMMUNITY): Payer: BLUE CROSS/BLUE SHIELD

## 2016-02-20 LAB — BASIC METABOLIC PANEL
ANION GAP: 10 (ref 5–15)
BUN: 7 mg/dL (ref 6–20)
CHLORIDE: 100 mmol/L — AB (ref 101–111)
CO2: 23 mmol/L (ref 22–32)
Calcium: 8 mg/dL — ABNORMAL LOW (ref 8.9–10.3)
Creatinine, Ser: 0.6 mg/dL (ref 0.44–1.00)
GFR calc non Af Amer: >60 mL/min (ref >60)
GLUCOSE: 82 mg/dL (ref 65–99)
POTASSIUM: 4.1 mmol/L (ref 3.5–5.1)
Sodium: 133 mmol/L — ABNORMAL LOW (ref 135–145)

## 2016-02-20 LAB — TYPE AND SCREEN
ABO/RH(D): O POS
Antibody Screen: NEGATIVE
UNIT DIVISION: 0
UNIT DIVISION: 0
Unit division: 0
Unit division: 0

## 2016-02-20 LAB — CBC WITH DIFFERENTIAL/PLATELET
Basophils Absolute: 0 10*3/uL (ref 0.0–0.1)
Basophils Relative: 0 %
EOS PCT: 1 %
Eosinophils Absolute: 0.2 10*3/uL (ref 0.0–0.7)
HEMATOCRIT: 29.2 % — AB (ref 36.0–46.0)
HEMOGLOBIN: 9.8 g/dL — AB (ref 12.0–15.0)
LYMPHS ABS: 2.5 10*3/uL (ref 0.7–4.0)
LYMPHS PCT: 10 %
MCH: 32.2 pg (ref 26.0–34.0)
MCHC: 33.6 g/dL (ref 30.0–36.0)
MCV: 96.1 fL (ref 78.0–100.0)
MONOS PCT: 12 %
Monocytes Absolute: 3 10*3/uL — ABNORMAL HIGH (ref 0.1–1.0)
NEUTROS PCT: 77 %
Neutro Abs: 18.9 10*3/uL — ABNORMAL HIGH (ref 1.7–7.7)
Platelets: 234 10*3/uL (ref 150–400)
RBC: 3.04 MIL/uL — AB (ref 3.87–5.11)
RDW: 13.9 % (ref 11.5–15.5)
WBC: 24.6 10*3/uL — AB (ref 4.0–10.5)

## 2016-02-20 MED ORDER — OXYCODONE HCL 5 MG PO TABS
5.0000 mg | ORAL_TABLET | ORAL | Status: DC | PRN
  Administered 2016-02-20: 15 mg via ORAL
  Administered 2016-02-20: 10 mg via ORAL
  Administered 2016-02-21 – 2016-02-23 (×13): 15 mg via ORAL
  Filled 2016-02-20 (×11): qty 3
  Filled 2016-02-20: qty 2
  Filled 2016-02-20 (×3): qty 3

## 2016-02-20 MED ORDER — FENTANYL BOLUS VIA INFUSION
50.0000 ug | INTRAVENOUS | Status: DC | PRN
  Filled 2016-02-20: qty 100

## 2016-02-20 NOTE — Procedures (Signed)
Extubation Procedure Note  Patient Details:   Name: Patricia Jefferson DOB: [birth year] MRN: [id number]   Airway Documentation:     Evaluation  O2 sats: stable throughout Complications: No apparent complications Patient did tolerate procedure well. Bilateral Breath Sounds: Clear, Diminished   Yes  Melanee Spryelson, Bryttney Netzer Lawson 02/20/2016, 9:48 AM

## 2016-02-20 NOTE — Progress Notes (Signed)
6cc of PCA morphine wasted in sink with Leo GrosserKim Meyran, RN.
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
## 2016-02-20 NOTE — Progress Notes (Signed)
OT Cancellation Note  Patient Details Name: Patricia MansonJessica A Jefferson MRN: [id number] DOB: 27-Apr-1986   Cancelled Treatment:    Reason Eval/Treat Not Completed: Medical issues which prohibited therapy.  Gaye AlkenBailey A Phelicia Dantes M.S., OTR/L Pager: [phone]  02/20/2016, 8:52 AM

## 2016-02-20 NOTE — Progress Notes (Signed)
100cc of Fentanyl wasted in sink with Philis NettleLisa Williams, RN.
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
## 2016-02-20 NOTE — Progress Notes (Signed)
PT Cancellation Note  Patient Details Name: Julieanne MansonJessica A Busick MRN: [id number] DOB: [birth year]   Cancelled Treatment:    Reason Eval/Treat Not Completed: Medical issues which prohibited therapy   Fabio AsaWerner, Annjanette Wertenberger J 02/20/2016, 9:24 AM Charlotte Crumbevon Shalene Gallen, PT DPT  [phone]
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
## 2016-02-21 ENCOUNTER — Inpatient Hospital Stay (HOSPITAL_COMMUNITY): Payer: BLUE CROSS/BLUE SHIELD

## 2016-02-21 LAB — CBC
HEMATOCRIT: 31.3 % — AB (ref 36.0–46.0)
Hemoglobin: 10.4 g/dL — ABNORMAL LOW (ref 12.0–15.0)
MCH: 32 pg (ref 26.0–34.0)
MCHC: 33.2 g/dL (ref 30.0–36.0)
MCV: 96.3 fL (ref 78.0–100.0)
PLATELETS: 329 10*3/uL (ref 150–400)
RBC: 3.25 MIL/uL — AB (ref 3.87–5.11)
RDW: 13.9 % (ref 11.5–15.5)
WBC: 21.8 10*3/uL — AB (ref 4.0–10.5)

## 2016-02-21 LAB — CULTURE, BAL-QUANTITATIVE W GRAM STAIN

## 2016-02-21 LAB — CULTURE, BAL-QUANTITATIVE: CULTURE: NORMAL — AB

## 2016-02-21 MED ORDER — IPRATROPIUM-ALBUTEROL 0.5-2.5 (3) MG/3ML IN SOLN
3.0000 mL | Freq: Two times a day (BID) | RESPIRATORY_TRACT | Status: DC
  Administered 2016-02-21 – 2016-02-23 (×4): 3 mL via RESPIRATORY_TRACT
  Filled 2016-02-21 (×4): qty 3

## 2016-02-21 MED ORDER — TRAMADOL HCL 50 MG PO TABS
50.0000 mg | ORAL_TABLET | Freq: Four times a day (QID) | ORAL | Status: DC
  Administered 2016-02-21 – 2016-02-23 (×10): 50 mg via ORAL
  Filled 2016-02-21 (×10): qty 1
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
## 2016-02-22 ENCOUNTER — Inpatient Hospital Stay (HOSPITAL_COMMUNITY): Payer: BLUE CROSS/BLUE SHIELD

## 2016-02-22 LAB — CBC
HCT: 31.5 % — ABNORMAL LOW (ref 36.0–46.0)
Hemoglobin: 10.1 g/dL — ABNORMAL LOW (ref 12.0–15.0)
MCH: 31.4 pg (ref 26.0–34.0)
MCHC: 32.1 g/dL (ref 30.0–36.0)
MCV: 97.8 fL (ref 78.0–100.0)
PLATELETS: 445 10*3/uL — AB (ref 150–400)
RBC: 3.22 MIL/uL — AB (ref 3.87–5.11)
RDW: 13.9 % (ref 11.5–15.5)
WBC: 19.5 10*3/uL — AB (ref 4.0–10.5)
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
## 2016-02-22 NOTE — Progress Notes (Signed)
Patient capable of doing Flutter valve on her on at this time. Will check in again at next scheduled breathing treatment.

## 2016-02-22 NOTE — Clinical Social Work Note (Signed)
CSW met with pt and completed SBIRT. CSW also informed by pt that PT said pt may be able to discharge home if she continues to do well with therapy in the hospital.

## 2016-02-23 ENCOUNTER — Inpatient Hospital Stay (HOSPITAL_COMMUNITY): Payer: BLUE CROSS/BLUE SHIELD

## 2016-02-23 LAB — BASIC METABOLIC PANEL
Anion gap: 8 (ref 5–15)
BUN: 7 mg/dL (ref 6–20)
CHLORIDE: 103 mmol/L (ref 101–111)
CO2: 25 mmol/L (ref 22–32)
Calcium: 8.7 mg/dL — ABNORMAL LOW (ref 8.9–10.3)
Creatinine, Ser: 0.58 mg/dL (ref 0.44–1.00)
GFR calc Af Amer: >60 mL/min (ref >60)
GFR calc non Af Amer: >60 mL/min (ref >60)
GLUCOSE: 97 mg/dL (ref 65–99)
POTASSIUM: 4.3 mmol/L (ref 3.5–5.1)
Sodium: 136 mmol/L (ref 135–145)

## 2016-02-23 LAB — CBC
HCT: 31.6 % — ABNORMAL LOW (ref 36.0–46.0)
HEMOGLOBIN: 10.2 g/dL — AB (ref 12.0–15.0)
MCH: 31.5 pg (ref 26.0–34.0)
MCHC: 32.3 g/dL (ref 30.0–36.0)
MCV: 97.5 fL (ref 78.0–100.0)
PLATELETS: 554 10*3/uL — AB (ref 150–400)
RBC: 3.24 MIL/uL — AB (ref 3.87–5.11)
RDW: 13.8 % (ref 11.5–15.5)
WBC: 20.2 10*3/uL — ABNORMAL HIGH (ref 4.0–10.5)

## 2016-02-23 MED ORDER — OXYCODONE-ACETAMINOPHEN 7.5-325 MG PO TABS: 1.0000 | ORAL_TABLET | ORAL | 0 refills | Status: DC | PRN

## 2016-02-23 MED ORDER — FENTANYL CITRATE (PF) 100 MCG/2ML IJ SOLN
50.0000 ug | Freq: Once | INTRAMUSCULAR | Status: AC
  Administered 2016-02-23: 50 ug via INTRAVENOUS

## 2016-02-23 MED ORDER — PNEUMOCOCCAL 13-VAL CONJ VACC IM SUSP: 0.5000 mL | INTRAMUSCULAR | Status: DC

## 2016-02-23 MED ORDER — PNEUMOCOCCAL VAC POLYVALENT 25 MCG/0.5ML IJ INJ: 0.5000 mL | INJECTION | Freq: Once | INTRAMUSCULAR | Status: DC

## 2016-02-23 MED ORDER — MENINGOCOCCAL A C Y&W-135 OLIG IM SOLR
0.5000 mL | Freq: Once | INTRAMUSCULAR | Status: AC
  Administered 2016-02-23: 0.5 mL via INTRAMUSCULAR
  Filled 2016-02-23: qty 0.5

## 2016-02-23 MED ORDER — PNEUMOCOCCAL 13-VAL CONJ VACC IM SUSP
0.5000 mL | Freq: Once | INTRAMUSCULAR | Status: DC
  Filled 2016-02-23: qty 0.5

## 2016-02-23 MED ORDER — TRAMADOL HCL 50 MG PO TABS: 50.0000 mg | ORAL_TABLET | Freq: Four times a day (QID) | ORAL | 0 refills | Status: DC

## 2016-02-23 MED ORDER — HAEMOPHILUS B POLYSAC CONJ VAC IM SOLR
0.5000 mL | Freq: Once | INTRAMUSCULAR | Status: AC
  Administered 2016-02-23: 0.5 mL via INTRAMUSCULAR
  Filled 2016-02-23: qty 0.5

## 2016-02-23 MED ORDER — PNEUMOCOCCAL 13-VAL CONJ VACC IM SUSP
0.5000 mL | Freq: Once | INTRAMUSCULAR | Status: AC
  Administered 2016-02-23: 0.5 mL via INTRAMUSCULAR
  Filled 2016-02-23: qty 0.5

## 2016-02-23 NOTE — Progress Notes (Signed)
Patricia MansonJessica A Jefferson to be D/C'd  per MD order. Discussed with the patient and all questions fully answered.  VSS, Skin clean, dry and intact without evidence of skin break down, no evidence of skin tears noted.  IV catheter discontinued intact. Site without signs and symptoms of complications. Dressing and pressure applied.  An After Visit Summary was printed and given to the patient. Patient received prescription.  D/c education completed with patient/family including follow up instructions, medication list, d/c activities limitations if indicated, with other d/c instructions as indicated by MD - patient able to verbalize understanding, all questions fully answered.   Patient instructed to return to ED, call 911, or call MD for any changes in condition.   Patient to be escorted via WC, and D/C home via private auto.
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
## 2016-02-23 NOTE — Progress Notes (Signed)
PT Cancellation Note  Patient Details Name: Julieanne MansonJessica A Rekowski MRN: [id number] DOB: 1986/02/01   Cancelled Treatment:    Reason Eval/Treat Not Completed: Patient at procedure or test/unavailable. Pt in bathroom.  Per visitor, she walked in there on her own. Will check back later as schedule permits.   London Tarnowski LUBECK 02/23/2016, 10:29 AM
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
## 2016-02-23 NOTE — Discharge Instructions (Signed)
Leave chest dressing on until Wednesday. Wash wounds daily in shower with soap and water. Do not soak. Apply antibiotic ointment (e.g. Neosporin) twice daily and as needed to keep moist. Cover with dry dressing if desired.  No lifting more than 10 pounds for 6 weeks.  No driving while taking oxycodone.
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
## 2016-03-01 ENCOUNTER — Telehealth (HOSPITAL_COMMUNITY): Payer: Self-pay

## 2016-03-01 NOTE — Telephone Encounter (Signed)
Wrote rx for [employer] 7.5/325, #20

## 2016-05-24 NOTE — L&D Delivery Note (Signed)
   Delivery Note FHR had some bradycardia in the 70-80s and pt was noted to be C/C/+2. After a 10 minute 2nd stage during which she received 02, a 11:27 PM a viable female was delivered via Vaginal, Spontaneous Delivery (Presentation: ;LOA).  APGAR: 8, 9; weight  Pending.  After 1 minute, the cord was clamped and cut. 40 units of pitocin diluted in 1000cc LR was infused rapidly IV.  The placenta separated spontaneously and delivered via CCT and maternal pushing effort.  It was inspected and appears to be intact with a 3 VC. Marland Kitchen.   Anesthesia:  epidural Episiotomy: None Lacerations: None Suture Repair:  Est. Blood Loss (mL): 98  Mom to postpartum.  Baby to Couplet care / Skin to Skin.  The above was performed by Anna Genrelay Carter, med student, under my direct supervision and guidance.  Pt was augmented w/foley and 1 cytotec after PROM.   CRESENZO-DISHMAN,[name] 01/27/2017, 11:45 PM

## 2016-07-20 ENCOUNTER — Encounter: Payer: Self-pay | Admitting: Adult Health

## 2016-07-20 ENCOUNTER — Ambulatory Visit (INDEPENDENT_AMBULATORY_CARE_PROVIDER_SITE_OTHER): Payer: Self-pay | Admitting: Adult Health

## 2016-07-20 VITALS — BP 90/62 | HR 86 | Ht 66.0 in | Wt 126.0 lb

## 2016-07-20 DIAGNOSIS — N926 Irregular menstruation, unspecified: Secondary | ICD-10-CM

## 2016-07-20 DIAGNOSIS — Z3490 Encounter for supervision of normal pregnancy, unspecified, unspecified trimester: Secondary | ICD-10-CM

## 2016-07-20 DIAGNOSIS — O3680X0 Pregnancy with inconclusive fetal viability, not applicable or unspecified: Secondary | ICD-10-CM

## 2016-07-20 DIAGNOSIS — R112 Nausea with vomiting, unspecified: Secondary | ICD-10-CM

## 2016-07-20 DIAGNOSIS — Z3201 Encounter for pregnancy test, result positive: Secondary | ICD-10-CM

## 2016-07-20 LAB — POCT URINE PREGNANCY: PREG TEST UR: POSITIVE — AB
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
## 2016-07-20 NOTE — Patient Instructions (Signed)
First Trimester of Pregnancy The first trimester of pregnancy is from week 1 until the end of week 13 (months 1 through 3). A week after a sperm fertilizes an egg, the egg will implant on the wall of the uterus. This embryo will begin to develop into a baby. Genes from you and your partner will form the baby. The female genes will determine whether the baby will be a boy or a girl. At 6-8 weeks, the eyes and face will be formed, and the heartbeat can be seen on ultrasound. At the end of 12 weeks, all the baby's organs will be formed. Now that you are pregnant, you will want to do everything you can to have a healthy baby. Two of the most important things are to get good prenatal care and to follow your health care provider's instructions. Prenatal care is all the medical care you receive before the baby's birth. This care will help prevent, find, and treat any problems during the pregnancy and childbirth. Body changes during your first trimester Your body goes through many changes during pregnancy. The changes vary from woman to woman.  You may gain or lose a couple of pounds at first.  You may feel sick to your stomach (nauseous) and you may throw up (vomit). If the vomiting is uncontrollable, call your health care provider.  You may tire easily.  You may develop headaches that can be relieved by medicines. All medicines should be approved by your health care provider.  You may urinate more often. Painful urination may mean you have a bladder infection.  You may develop heartburn as a result of your pregnancy.  You may develop constipation because certain hormones are causing the muscles that push stool through your intestines to slow down.  You may develop hemorrhoids or swollen veins (varicose veins).  Your breasts may begin to grow larger and become tender. Your nipples may stick out more, and the tissue that surrounds them (areola) may become darker.  Your gums may bleed and may be  sensitive to brushing and flossing.  Dark spots or blotches (chloasma, mask of pregnancy) may develop on your face. This will likely fade after the baby is born.  Your menstrual periods will stop.  You may have a loss of appetite.  You may develop cravings for certain kinds of food.  You may have changes in your emotions from day to day, such as being excited to be pregnant or being concerned that something may go wrong with the pregnancy and baby.  You may have more vivid and strange dreams.  You may have changes in your hair. These can include thickening of your hair, rapid growth, and changes in texture. Some women also have hair loss during or after pregnancy, or hair that feels dry or thin. Your hair will most likely return to normal after your baby is born. What to expect at prenatal visits During a routine prenatal visit:  You will be weighed to make sure you and the baby are growing normally.  Your blood pressure will be taken.  Your abdomen will be measured to track your baby's growth.  The fetal heartbeat will be listened to between weeks 10 and 14 of your pregnancy.  Test results from any previous visits will be discussed. Your health care provider may ask you:  How you are feeling.  If you are feeling the baby move.  If you have had any abnormal symptoms, such as leaking fluid, bleeding, severe headaches, or abdominal   cramping.  If you are using any tobacco products, including cigarettes, chewing tobacco, and electronic cigarettes.  If you have any questions. Other tests that may be performed during your first trimester include:  Blood tests to find your blood type and to check for the presence of any previous infections. The tests will also be used to check for low iron levels (anemia) and protein on red blood cells (Rh antibodies). Depending on your risk factors, or if you previously had diabetes during pregnancy, you may have tests to check for high blood sugar  that affects pregnant women (gestational diabetes).  Urine tests to check for infections, diabetes, or protein in the urine.  An ultrasound to confirm the proper growth and development of the baby.  Fetal screens for spinal cord problems (spina bifida) and Down syndrome.  HIV (human immunodeficiency virus) testing. Routine prenatal testing includes screening for HIV, unless you choose not to have this test.  You may need other tests to make sure you and the baby are doing well. Follow these instructions at home: Medicines   Follow your health care provider's instructions regarding medicine use. Specific medicines may be either safe or unsafe to take during pregnancy.  Take a prenatal vitamin that contains at least 600 micrograms (mcg) of folic acid.  If you develop constipation, try taking a stool softener if your health care provider approves. Eating and drinking   Eat a balanced diet that includes fresh fruits and vegetables, whole grains, good sources of protein such as meat, eggs, or tofu, and low-fat dairy. Your health care provider will help you determine the amount of weight gain that is right for you.  Avoid raw meat and uncooked cheese. These carry germs that can cause birth defects in the baby.  Eating four or five small meals rather than three large meals a day may help relieve nausea and vomiting. If you start to feel nauseous, eating a few soda crackers can be helpful. Drinking liquids between meals, instead of during meals, also seems to help ease nausea and vomiting.  Limit foods that are high in fat and processed sugars, such as fried and sweet foods.  To prevent constipation:  Eat foods that are high in fiber, such as fresh fruits and vegetables, whole grains, and beans.  Drink enough fluid to keep your urine clear or pale yellow. Activity   Exercise only as directed by your health care provider. Most women can continue their usual exercise routine during  pregnancy. Try to exercise for 30 minutes at least 5 days a week. Exercising will help you:  Control your weight.  Stay in shape.  Be prepared for labor and delivery.  Experiencing pain or cramping in the lower abdomen or lower back is a good sign that you should stop exercising. Check with your health care provider before continuing with normal exercises.  Try to avoid standing for long periods of time. Move your legs often if you must stand in one place for a long time.  Avoid heavy lifting.  Wear low-heeled shoes and practice good posture.  You may continue to have sex unless your health care provider tells you not to. Relieving pain and discomfort   Wear a good support bra to relieve breast tenderness.  Take warm sitz baths to soothe any pain or discomfort caused by hemorrhoids. Use hemorrhoid cream if your health care provider approves.  Rest with your legs elevated if you have leg cramps or low back pain.  If you develop   varicose veins in your legs, wear support hose. Elevate your feet for 15 minutes, 3-4 times a day. Limit salt in your diet. Prenatal care   Schedule your prenatal visits by the twelfth week of pregnancy. They are usually scheduled monthly at first, then more often in the last 2 months before delivery.  Write down your questions. Take them to your prenatal visits.  Keep all your prenatal visits as told by your health care provider. This is important. Safety   Wear your seat belt at all times when driving.  Make a list of emergency phone numbers, including numbers for family, friends, the hospital, and police and fire departments. General instructions   Ask your health care provider for a referral to a local prenatal education class. Begin classes no later than the beginning of month 6 of your pregnancy.  Ask for help if you have counseling or nutritional needs during pregnancy. Your health care provider can offer advice or refer you to specialists for  help with various needs.  Do not use hot tubs, steam rooms, or saunas.  Do not douche or use tampons or scented sanitary pads.  Do not cross your legs for long periods of time.  Avoid cat litter boxes and soil used by cats. These carry germs that can cause birth defects in the baby and possibly loss of the fetus by miscarriage or stillbirth.  Avoid all smoking, herbs, alcohol, and medicines not prescribed by your health care provider. Chemicals in these products affect the formation and growth of the baby.  Do not use any products that contain nicotine or tobacco, such as cigarettes and e-cigarettes. If you need help quitting, ask your health care provider. You may receive counseling support and other resources to help you quit.  Schedule a dentist appointment. At home, brush your teeth with a soft toothbrush and be gentle when you floss. Contact a health care provider if:  You have dizziness.  You have mild pelvic cramps, pelvic pressure, or nagging pain in the abdominal area.  You have persistent nausea, vomiting, or diarrhea.  You have a bad smelling vaginal discharge.  You have pain when you urinate.  You notice increased swelling in your face, hands, legs, or ankles.  You are exposed to fifth disease or chickenpox.  You are exposed to German measles (rubella) and have never had it. Get help right away if:  You have a fever.  You are leaking fluid from your vagina.  You have spotting or bleeding from your vagina.  You have severe abdominal cramping or pain.  You have rapid weight gain or loss.  You vomit blood or material that looks like coffee grounds.  You develop a severe headache.  You have shortness of breath.  You have any kind of trauma, such as from a fall or a car accident. Summary  The first trimester of pregnancy is from week 1 until the end of week 13 (months 1 through 3).  Your body goes through many changes during pregnancy. The changes vary  from woman to woman.  You will have routine prenatal visits. During those visits, your health care provider will examine you, discuss any test results you may have, and talk with you about how you are feeling. This information is not intended to replace advice given to you by your health care provider. Make sure you discuss any questions you have with your health care provider. Document Released: 05/04/2001 Document Revised: 04/21/2016 Document Reviewed: 04/21/2016 Elsevier Interactive Patient Education    2017 [employer] Eat often US in 3 days

## 2016-07-23 ENCOUNTER — Ambulatory Visit: Payer: Self-pay

## 2016-08-02 ENCOUNTER — Other Ambulatory Visit: Payer: Self-pay

## 2016-08-10 ENCOUNTER — Ambulatory Visit (INDEPENDENT_AMBULATORY_CARE_PROVIDER_SITE_OTHER): Payer: Medicaid Other

## 2016-08-10 ENCOUNTER — Other Ambulatory Visit: Payer: Self-pay | Admitting: Adult Health

## 2016-08-10 DIAGNOSIS — O3680X0 Pregnancy with inconclusive fetal viability, not applicable or unspecified: Secondary | ICD-10-CM | POA: Diagnosis not present

## 2016-08-10 NOTE — Progress Notes (Addendum)
US 16+1 wks,cephalic,post pl gr 0,normal ovaries bilat,cx 3.1 cm,fhr 160 bpm,please have pt come back for anatomy scan,EDD 01/24/2017

## 2016-08-24 ENCOUNTER — Ambulatory Visit: Payer: Medicaid Other | Admitting: *Deleted

## 2016-08-24 ENCOUNTER — Encounter: Payer: Self-pay | Admitting: Women's Health

## 2016-08-24 ENCOUNTER — Other Ambulatory Visit (HOSPITAL_COMMUNITY)
Admission: RE | Admit: 2016-08-24 | Discharge: 2016-08-24 | Disposition: A | Discharge status: Home / Self Care | Payer: BLUE CROSS/BLUE SHIELD | Source: Ambulatory Visit | Attending: Obstetrics & Gynecology | Admitting: Obstetrics & Gynecology

## 2016-08-24 ENCOUNTER — Ambulatory Visit (INDEPENDENT_AMBULATORY_CARE_PROVIDER_SITE_OTHER): Payer: Medicaid Other | Admitting: Women's Health

## 2016-08-24 VITALS — BP 90/60 | HR 84 | Wt 140.0 lb

## 2016-08-24 DIAGNOSIS — Z363 Encounter for antenatal screening for malformations: Secondary | ICD-10-CM

## 2016-08-24 DIAGNOSIS — Z113 Encounter for screening for infections with a predominantly sexual mode of transmission: Secondary | ICD-10-CM | POA: Diagnosis present

## 2016-08-24 DIAGNOSIS — Z1389 Encounter for screening for other disorder: Secondary | ICD-10-CM

## 2016-08-24 DIAGNOSIS — Z331 Pregnant state, incidental: Secondary | ICD-10-CM

## 2016-08-24 DIAGNOSIS — Z01411 Encounter for gynecological examination (general) (routine) with abnormal findings: Secondary | ICD-10-CM | POA: Diagnosis present

## 2016-08-24 DIAGNOSIS — Z3482 Encounter for supervision of other normal pregnancy, second trimester: Secondary | ICD-10-CM | POA: Diagnosis not present

## 2016-08-24 DIAGNOSIS — Z9049 Acquired absence of other specified parts of digestive tract: Secondary | ICD-10-CM | POA: Insufficient documentation

## 2016-08-24 DIAGNOSIS — Q8901 Asplenia (congenital): Secondary | ICD-10-CM | POA: Insufficient documentation

## 2016-08-24 DIAGNOSIS — F17200 Nicotine dependence, unspecified, uncomplicated: Secondary | ICD-10-CM | POA: Insufficient documentation

## 2016-08-24 DIAGNOSIS — Z3490 Encounter for supervision of normal pregnancy, unspecified, unspecified trimester: Secondary | ICD-10-CM | POA: Insufficient documentation

## 2016-08-24 DIAGNOSIS — Z1151 Encounter for screening for human papillomavirus (HPV): Secondary | ICD-10-CM | POA: Diagnosis not present

## 2016-08-24 DIAGNOSIS — Z124 Encounter for screening for malignant neoplasm of cervix: Secondary | ICD-10-CM

## 2016-08-24 DIAGNOSIS — Z3A18 18 weeks gestation of pregnancy: Secondary | ICD-10-CM

## 2016-08-24 LAB — POCT URINALYSIS DIPSTICK
Blood, UA: NEGATIVE
Glucose, UA: NEGATIVE
KETONES UA: NEGATIVE
NITRITE UA: NEGATIVE
Protein, UA: NEGATIVE
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
## 2016-08-24 NOTE — Patient Instructions (Signed)

## 2016-08-25 LAB — CBC
HEMATOCRIT: 39.1 % (ref 34.0–46.6)
HEMOGLOBIN: 12.8 g/dL (ref 11.1–15.9)
MCH: 33.2 pg — AB (ref 26.6–33.0)
MCHC: 32.7 g/dL (ref 31.5–35.7)
MCV: 102 fL — AB (ref 79–97)
Platelets: 374 10*3/uL (ref 150–379)
RBC: 3.85 x10E6/uL (ref 3.77–5.28)
RDW: 14.6 % (ref 12.3–15.4)
WBC: 19.3 10*3/uL — ABNORMAL HIGH (ref 3.4–10.8)

## 2016-08-25 LAB — MICROSCOPIC EXAMINATION
CASTS: NONE SEEN /LPF
Epithelial Cells (non renal): >10 /hpf — AB (ref 0–10)

## 2016-08-25 LAB — PMP SCREEN PROFILE (10S), URINE
Amphetamine Screen, Ur: NEGATIVE ng/mL
BARBITURATE SCRN UR: NEGATIVE ng/mL
Benzodiazepine Screen, Urine: NEGATIVE ng/mL
COCAINE(METAB.) SCREEN, URINE: NEGATIVE ng/mL
Cannabinoids Ur Ql Scn: NEGATIVE ng/mL
Creatinine(Crt), U: 84.8 mg/dL (ref 20.0–300.0)
METHADONE SCREEN, URINE: NEGATIVE ng/mL
OPIATE SCRN UR: NEGATIVE ng/mL
OXYCODONE+OXYMORPHONE UR QL SCN: NEGATIVE ng/mL
PCP Scrn, Ur: NEGATIVE ng/mL
PROPOXYPHENE SCREEN: NEGATIVE ng/mL
Ph of Urine: 6.2 (ref 4.5–8.9)

## 2016-08-25 LAB — RPR: RPR: NONREACTIVE

## 2016-08-25 LAB — ANTIBODY SCREEN: Antibody Screen: NEGATIVE

## 2016-08-25 LAB — SPECIMEN STATUS REPORT

## 2016-08-25 LAB — ABO/RH: Rh Factor: POSITIVE

## 2016-08-25 LAB — URINALYSIS, ROUTINE W REFLEX MICROSCOPIC
BILIRUBIN UA: NEGATIVE
Glucose, UA: NEGATIVE
Ketones, UA: NEGATIVE
NITRITE UA: NEGATIVE
PH UA: 6.5 (ref 5.0–7.5)
PROTEIN UA: NEGATIVE
RBC UA: NEGATIVE
Specific Gravity, UA: 1.017 (ref 1.005–1.030)
UUROB: 0.2 mg/dL (ref 0.2–1.0)

## 2016-08-25 LAB — HIV ANTIBODY (ROUTINE TESTING W REFLEX): HIV SCREEN 4TH GENERATION: NONREACTIVE

## 2016-08-25 LAB — RUBELLA SCREEN: RUBELLA: 1.68 {index} (ref >0.99)

## 2016-08-25 LAB — VARICELLA ZOSTER ANTIBODY, IGG: Varicella zoster IgG: 561 index (ref >165)

## 2016-08-25 LAB — HEPATITIS B SURFACE ANTIGEN: Hepatitis B Surface Ag: NEGATIVE

## 2016-08-26 ENCOUNTER — Telehealth: Payer: Self-pay | Admitting: *Deleted

## 2016-08-26 DIAGNOSIS — D72829 Elevated white blood cell count, unspecified: Secondary | ICD-10-CM

## 2016-08-26 DIAGNOSIS — Z331 Pregnant state, incidental: Secondary | ICD-10-CM

## 2016-08-26 LAB — URINE CULTURE

## 2016-08-27 NOTE — Telephone Encounter (Signed)
Pt returned call. Notified her of elevated wbc, looks like has been elevated for years doing chart review. Denies ever having this evaluated. Denies s/s current infection. Discussed I was unable to add diff to cbc she had drawn, so will need to come in for cbc w/ diff- she is unable to come until next week. Will let her know when I get results back. Cheral Marker, CNM, [facility] 08/27/2016 10:44 AM

## 2016-08-30 ENCOUNTER — Telehealth: Payer: Self-pay | Admitting: *Deleted

## 2016-08-30 LAB — CYTOLOGY - PAP
Chlamydia: NEGATIVE
Diagnosis: NEGATIVE
HPV (WINDOPATH): NOT DETECTED
Neisseria Gonorrhea: NEGATIVE

## 2016-08-30 NOTE — Telephone Encounter (Signed)
Called in error.    .

## 2016-09-07 ENCOUNTER — Encounter: Payer: Medicaid Other | Admitting: Women's Health

## 2016-09-08 ENCOUNTER — Ambulatory Visit (INDEPENDENT_AMBULATORY_CARE_PROVIDER_SITE_OTHER): Payer: Medicaid Other | Admitting: Women's Health

## 2016-09-08 ENCOUNTER — Other Ambulatory Visit: Payer: Self-pay | Admitting: Women's Health

## 2016-09-08 ENCOUNTER — Other Ambulatory Visit: Payer: Medicaid Other

## 2016-09-08 ENCOUNTER — Ambulatory Visit (INDEPENDENT_AMBULATORY_CARE_PROVIDER_SITE_OTHER): Payer: Medicaid Other

## 2016-09-08 ENCOUNTER — Encounter: Payer: Self-pay | Admitting: Women's Health

## 2016-09-08 VITALS — BP 92/56 | HR 68 | Wt 146.0 lb

## 2016-09-08 DIAGNOSIS — Z363 Encounter for antenatal screening for malformations: Secondary | ICD-10-CM

## 2016-09-08 DIAGNOSIS — Z3682 Encounter for antenatal screening for nuchal translucency: Secondary | ICD-10-CM

## 2016-09-08 DIAGNOSIS — Z3482 Encounter for supervision of other normal pregnancy, second trimester: Secondary | ICD-10-CM
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
## 2016-09-08 NOTE — Progress Notes (Addendum)
[place] 20+2 wks,breech/cephalic,post pl gr 0,normal ovaries bilat,cx 4.8 cm,fhr 153 bpm,svp of fluid 4.8 cm,right choroid plexus cyst 4 x 4 x 2 mm,EFW 346 g,anatomy complete

## 2016-09-08 NOTE — Patient Instructions (Signed)
Tips for Helping Acne:  Wash face with warm (not hot) water twice daily with a gentle cleanser such as Cetaphil or Dove Sensitive Skin bar  Do not scrub face or pick at bumps/lesions  Use water-based (not oil-based) lotions, make-up, and hair products     Second Trimester of Pregnancy The second trimester is from week 14 through week 27 (months 4 through 6). The second trimester is often a time when you feel your best. Your body has adjusted to being pregnant, and you begin to feel better physically. Usually, morning sickness has lessened or quit completely, you may have more energy, and you may have an increase in appetite. The second trimester is also a time when the fetus is growing rapidly. At the end of the sixth month, the fetus is about 9 inches long and weighs about 1 pounds. You will likely begin to feel the baby move (quickening) between 16 and 20 weeks of pregnancy. Body changes during your second trimester Your body continues to go through many changes during your second trimester. The changes vary from woman to woman.  Your weight will continue to increase. You will notice your lower abdomen bulging out.  You may begin to get stretch marks on your hips, abdomen, and breasts.  You may develop headaches that can be relieved by medicines. The medicines should be approved by your health care provider.  You may urinate more often because the fetus is pressing on your bladder.  You may develop or continue to have heartburn as a result of your pregnancy.  You may develop constipation because certain hormones are causing the muscles that push waste through your intestines to slow down.  You may develop hemorrhoids or swollen, bulging veins (varicose veins).  You may have back pain. This is caused by:  Weight gain.  Pregnancy hormones that are relaxing the joints in your pelvis.  A shift in weight and the muscles that support your balance.  Your breasts will continue to grow  and they will continue to become tender.  Your gums may bleed and may be sensitive to brushing and flossing.  Dark spots or blotches (chloasma, mask of pregnancy) may develop on your face. This will likely fade after the baby is born.  A dark line from your belly button to the pubic area (linea nigra) may appear. This will likely fade after the baby is born.  You may have changes in your hair. These can include thickening of your hair, rapid growth, and changes in texture. Some women also have hair loss during or after pregnancy, or hair that feels dry or thin. Your hair will most likely return to normal after your baby is born. What to expect at prenatal visits During a routine prenatal visit:  You will be weighed to make sure you and the fetus are growing normally.  Your blood pressure will be taken.  Your abdomen will be measured to track your baby's growth.  The fetal heartbeat will be listened to.  Any test results from the previous visit will be discussed. Your health care provider may ask you:  How you are feeling.  If you are feeling the baby move.  If you have had any abnormal symptoms, such as leaking fluid, bleeding, severe headaches, or abdominal cramping.  If you are using any tobacco products, including cigarettes, chewing tobacco, and electronic cigarettes.  If you have any questions. Other tests that may be performed during your second trimester include:  Blood tests that check  for:  Low iron levels (anemia).  High blood sugar that affects pregnant women (gestational diabetes) between 26 and 28 weeks.  Rh antibodies. This is to check for a protein on red blood cells (Rh factor).  Urine tests to check for infections, diabetes, or protein in the urine.  An ultrasound to confirm the proper growth and development of the baby.  An amniocentesis to check for possible genetic problems.  Fetal screens for spina bifida and Down syndrome.  HIV (human  immunodeficiency virus) testing. Routine prenatal testing includes screening for HIV, unless you choose not to have this test. Follow these instructions at home: Medicines   Follow your health care provider's instructions regarding medicine use. Specific medicines may be either safe or unsafe to take during pregnancy.  Take a prenatal vitamin that contains at least 600 micrograms (mcg) of folic acid.  If you develop constipation, try taking a stool softener if your health care provider approves. Eating and drinking   Eat a balanced diet that includes fresh fruits and vegetables, whole grains, good sources of protein such as meat, eggs, or tofu, and low-fat dairy. Your health care provider will help you determine the amount of weight gain that is right for you.  Avoid raw meat and uncooked cheese. These carry germs that can cause birth defects in the baby.  If you have low calcium intake from food, talk to your health care provider about whether you should take a daily calcium supplement.  Limit foods that are high in fat and processed sugars, such as fried and sweet foods.  To prevent constipation:  Drink enough fluid to keep your urine clear or pale yellow.  Eat foods that are high in fiber, such as fresh fruits and vegetables, whole grains, and beans. Activity   Exercise only as directed by your health care provider. Most women can continue their usual exercise routine during pregnancy. Try to exercise for 30 minutes at least 5 days a week. Stop exercising if you experience uterine contractions.  Avoid heavy lifting, wear low heel shoes, and practice good posture.  A sexual relationship may be continued unless your health care provider directs you otherwise. Relieving pain and discomfort   Wear a good support bra to prevent discomfort from breast tenderness.  Take warm sitz baths to soothe any pain or discomfort caused by hemorrhoids. Use hemorrhoid cream if your health care  provider approves.  Rest with your legs elevated if you have leg cramps or low back pain.  If you develop varicose veins, wear support hose. Elevate your feet for 15 minutes, 3-4 times a day. Limit salt in your diet. Prenatal Care   Write down your questions. Take them to your prenatal visits.  Keep all your prenatal visits as told by your health care provider. This is important. Safety   Wear your seat belt at all times when driving.  Make a list of emergency phone numbers, including numbers for family, friends, the hospital, and police and fire departments. General instructions   Ask your health care provider for a referral to a local prenatal education class. Begin classes no later than the beginning of month 6 of your pregnancy.  Ask for help if you have counseling or nutritional needs during pregnancy. Your health care provider can offer advice or refer you to specialists for help with various needs.  Do not use hot tubs, steam rooms, or saunas.  Do not douche or use tampons or scented sanitary pads.  Do not cross  your legs for long periods of time.  Avoid cat litter boxes and soil used by cats. These carry germs that can cause birth defects in the baby and possibly loss of the fetus by miscarriage or stillbirth.  Avoid all smoking, herbs, alcohol, and unprescribed drugs. Chemicals in these products can affect the formation and growth of the baby.  Do not use any products that contain nicotine or tobacco, such as cigarettes and e-cigarettes. If you need help quitting, ask your health care provider.  Visit your dentist if you have not gone yet during your pregnancy. Use a soft toothbrush to brush your teeth and be gentle when you floss. Contact a health care provider if:  You have dizziness.  You have mild pelvic cramps, pelvic pressure, or nagging pain in the abdominal area.  You have persistent nausea, vomiting, or diarrhea.  You have a bad smelling vaginal  discharge.  You have pain when you urinate. Get help right away if:  You have a fever.  You are leaking fluid from your vagina.  You have spotting or bleeding from your vagina.  You have severe abdominal cramping or pain.  You have rapid weight gain or weight loss.  You have shortness of breath with chest pain.  You notice sudden or extreme swelling of your face, hands, ankles, feet, or legs.  You have not felt your baby move in over an hour.  You have severe headaches that do not go away when you take medicine.  You have vision changes. Summary  The second trimester is from week 14 through week 27 (months 4 through 6). It is also a time when the fetus is growing rapidly.  Your body goes through many changes during pregnancy. The changes vary from woman to woman.  Avoid all smoking, herbs, alcohol, and unprescribed drugs. These chemicals affect the formation and growth your baby.  Do not use any tobacco products, such as cigarettes, chewing tobacco, and e-cigarettes. If you need help quitting, ask your health care provider.  Contact your health care provider if you have any questions. Keep all prenatal visits as told by your health care provider. This is important. This information is not intended to replace advice given to you by your health care provider. Make sure you discuss any questions you have with your health care provider. Document Released: 05/04/2001 Document Revised: 10/16/2015 Document Reviewed: 07/11/2012 Elsevier Interactive Patient Education  2017 [employer].

## 2016-09-09 LAB — CBC WITH DIFFERENTIAL
BASOS: 0 %
Basophils Absolute: 0.1 10*3/uL (ref 0.0–0.2)
EOS (ABSOLUTE): 0.2 10*3/uL (ref 0.0–0.4)
EOS: 1 %
HEMATOCRIT: 38 % (ref 34.0–46.6)
HEMOGLOBIN: 12.4 g/dL (ref 11.1–15.9)
IMMATURE GRANS (ABS): 0.4 10*3/uL — AB (ref 0.0–0.1)
Immature Granulocytes: 2 %
LYMPHS: 16 %
Lymphocytes Absolute: 2.8 10*3/uL (ref 0.7–3.1)
MCH: 33.6 pg — AB (ref 26.6–33.0)
MCHC: 32.6 g/dL (ref 31.5–35.7)
MCV: 103 fL — AB (ref 79–97)
MONOCYTES: 9 %
Monocytes Absolute: 1.7 10*3/uL — ABNORMAL HIGH (ref 0.1–0.9)
NEUTROS ABS: 12.8 10*3/uL — AB (ref 1.4–7.0)
Neutrophils: 72 %
RBC: 3.69 x10E6/uL — ABNORMAL LOW (ref 3.77–5.28)
RDW: 14.1 % (ref 12.3–15.4)
WBC: 17.9 10*3/uL — ABNORMAL HIGH (ref 3.4–10.8)

## 2016-09-11 LAB — AFP, QUAD SCREEN
DIA Mom Value: 2.92
DIA Value (EIA): 600.97 pg/mL
DSR (By Age)    1 IN: 468
DSR (SECOND TRIMESTER) 1 IN: 32
GESTATIONAL AGE AFP: 20.3 wk
MSAFP MOM: 0.8
MSAFP: 47.2 ng/mL
MSHCG Mom: 2.4
MSHCG: 57253 m[IU]/mL
Maternal Age At EDD: 32.7 yr
OSB RISK: 10000
T18 (By Age): 1:1824 {titer}
TEST RESULTS AFP: POSITIVE — AB
UE3 MOM: 1
WEIGHT: 146 [lb_av]
uE3 Value: 1.89 ng/mL

## 2016-09-13 ENCOUNTER — Telehealth: Payer: Self-pay | Admitting: Women's Health

## 2016-09-13 NOTE — Telephone Encounter (Signed)
LM for pt to return call. Need to discuss AFP results.  Cheral Marker, CNM, [facility] 09/13/2016 12:41 PM

## 2016-09-14 ENCOUNTER — Other Ambulatory Visit: Payer: Medicaid Other

## 2016-09-14 ENCOUNTER — Telehealth: Payer: Self-pay | Admitting: Adult Health

## 2016-09-14 NOTE — Telephone Encounter (Signed)
Pt aware AFP abnormal, +Down's to come in tomorrow at 10 am for labs

## 2016-09-14 NOTE — Telephone Encounter (Signed)
Left message to call about AFP, if she calls needs to know it was +for Down's and may want to do further testing

## 2016-09-15 ENCOUNTER — Other Ambulatory Visit: Payer: Medicaid Other

## 2016-09-15 DIAGNOSIS — O280 Abnormal hematological finding on antenatal screening of mother: Secondary | ICD-10-CM

## 2016-09-20 ENCOUNTER — Telehealth: Payer: Self-pay | Admitting: Women's Health

## 2016-09-20 LAB — INFORMASEQ(SM) WITH XY ANALYSIS
Fetal Fraction (%):: 13
Fetal Number: 1
Gestational Age at Collection: 21.2 weeks
Weight: 147 [lb_av]

## 2016-09-20 NOTE — Telephone Encounter (Signed)
LM Informaseq normal, if questions can call.  Cheral Marker, CNM, [facility] 09/20/2016 3:44 PM

## 2016-10-06 ENCOUNTER — Ambulatory Visit (INDEPENDENT_AMBULATORY_CARE_PROVIDER_SITE_OTHER): Payer: Medicaid Other | Admitting: Obstetrics and Gynecology

## 2016-10-06 ENCOUNTER — Encounter: Payer: Self-pay | Admitting: Obstetrics and Gynecology

## 2016-10-06 VITALS — BP 94/60 | HR 80 | Wt 157.0 lb

## 2016-10-06 DIAGNOSIS — Z3482 Encounter for supervision of other normal pregnancy, second trimester: Secondary | ICD-10-CM

## 2016-10-06 DIAGNOSIS — Z1389 Encounter for screening for other disorder: Secondary | ICD-10-CM

## 2016-10-06 DIAGNOSIS — Z331 Pregnant state, incidental: Secondary | ICD-10-CM

## 2016-10-06 DIAGNOSIS — Z3009 Encounter for other general counseling and advice on contraception: Secondary | ICD-10-CM

## 2016-10-06 LAB — POCT URINALYSIS DIPSTICK
Glucose, UA: NEGATIVE
Ketones, UA: NEGATIVE
NITRITE UA: NEGATIVE
PROTEIN UA: NEGATIVE
RBC UA: NEGATIVE
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
## 2016-10-14 ENCOUNTER — Telehealth: Payer: Self-pay | Admitting: Obstetrics and Gynecology

## 2016-10-14 NOTE — Telephone Encounter (Signed)
Pt called and left a message on the machine stating that she would like to speak with Dr. Emelda FearFerguson, Pt did not leave a detail message on the line. Please contact pt

## 2016-10-14 NOTE — Telephone Encounter (Signed)
LMOVM that Dr Emelda FearFerguson is out of the office but to call back if she would like to discuss with nurse.

## 2016-10-19 ENCOUNTER — Telehealth: Payer: Self-pay | Admitting: Obstetrics and Gynecology

## 2016-10-19 NOTE — Telephone Encounter (Signed)
Pt called stating that Dr. Emelda FearFerguson was suppose to sent over a referral to an orthopedic, pt states that she called to set up the appointment and they stated that they never received the referral. Please contact pt

## 2016-10-19 NOTE — Telephone Encounter (Signed)
Informed patient that referral was sent today for Dr Carlos LeveringGramig's office per dr Desiree HaneFergson's last office note. Verbalized understanding.

## 2016-10-20 ENCOUNTER — Telehealth: Payer: Self-pay | Admitting: *Deleted

## 2016-10-20 NOTE — Telephone Encounter (Signed)
[facility] called to inform us that patient has an appointment on November 08, 2016 @ 3:30.  10-20-16  AS

## 2016-10-20 NOTE — Telephone Encounter (Signed)
LMOVM that [facility] @330pm . The contact phone number was left for her in case she needed to make any changes.

## 2016-11-03 ENCOUNTER — Ambulatory Visit (INDEPENDENT_AMBULATORY_CARE_PROVIDER_SITE_OTHER): Payer: Medicaid Other | Admitting: Women's Health

## 2016-11-03 ENCOUNTER — Encounter: Payer: Self-pay | Admitting: Women's Health

## 2016-11-03 VITALS — BP 100/70 | HR 80 | Wt 164.3 lb

## 2016-11-03 DIAGNOSIS — Z331 Pregnant state, incidental: Secondary | ICD-10-CM

## 2016-11-03 DIAGNOSIS — Z3483 Encounter for supervision of other normal pregnancy, third trimester: Secondary | ICD-10-CM

## 2016-11-03 DIAGNOSIS — IMO0001 Reserved for inherently not codable concepts without codable children: Secondary | ICD-10-CM

## 2016-11-03 DIAGNOSIS — Z1389 Encounter for screening for other disorder: Secondary | ICD-10-CM | POA: Diagnosis not present

## 2016-11-03 DIAGNOSIS — O350XX0 Maternal care for (suspected) central nervous system malformation in fetus, not applicable or unspecified: Secondary | ICD-10-CM

## 2016-11-03 DIAGNOSIS — O26843 Uterine size-date discrepancy, third trimester: Secondary | ICD-10-CM

## 2016-11-03 LAB — POCT URINALYSIS DIPSTICK
Blood, UA: NEGATIVE
GLUCOSE UA: NEGATIVE
KETONES UA: NEGATIVE
Nitrite, UA: NEGATIVE

## 2016-11-03 NOTE — Patient Instructions (Addendum)
You will have your sugar test next visit.  Please do not eat or drink anything after midnight the night before you come, not even water.  You will be here for at least two hours.     Call the office ([phone]) or go to [facility] if:  You begin to have strong, frequent contractions  Your water breaks.  Sometimes it is a big gush of fluid, sometimes it is just a trickle that keeps getting your panties wet or running down your legs  You have vaginal bleeding.  It is normal to have a small amount of spotting if your cervix was checked.   You don't feel your baby moving like normal.  If you don't, get you something to eat and drink and lay down and focus on feeling your baby move.  You should feel at least 10 movements in 2 hours.  If you don't, you should call the office or go to [facility].    Tdap Vaccine  It is recommended that you get the Tdap vaccine during the third trimester of EACH pregnancy to help protect your baby from getting pertussis (whooping cough)  27-36 weeks is the BEST time to do this so that you can pass the protection on to your baby. During pregnancy is better than after pregnancy, but if you are unable to get it during pregnancy it will be offered at the hospital.   You can get this vaccine at the health department or your family doctor  Everyone who will be around your baby should also be up-to-date on their vaccines. Adults (who are not pregnant) only need 1 dose of Tdap during adulthood.   Third Trimester of Pregnancy The third trimester is from week 29 through week 42, months 7 through 9. The third trimester is a time when the fetus is growing rapidly. At the end of the ninth month, the fetus is about 20 inches in length and weighs 6-10 pounds.  BODY CHANGES Your body goes through many changes during pregnancy. The changes vary from woman to woman.   Your weight will continue to increase. You can expect to gain 25-35 pounds (11-16 kg) by the end of the  pregnancy.  You may begin to get stretch marks on your hips, abdomen, and breasts.  You may urinate more often because the fetus is moving lower into your pelvis and pressing on your bladder.  You may develop or continue to have heartburn as a result of your pregnancy.  You may develop constipation because certain hormones are causing the muscles that push waste through your intestines to slow down.  You may develop hemorrhoids or swollen, bulging veins (varicose veins).  You may have pelvic pain because of the weight gain and pregnancy hormones relaxing your joints between the bones in your pelvis. Backaches may result from overexertion of the muscles supporting your posture.  You may have changes in your hair. These can include thickening of your hair, rapid growth, and changes in texture. Some women also have hair loss during or after pregnancy, or hair that feels dry or thin. Your hair will most likely return to normal after your baby is born.  Your breasts will continue to grow and be tender. A yellow discharge may leak from your breasts called colostrum.  Your belly button may stick out.  You may feel short of breath because of your expanding uterus.  You may notice the fetus "dropping," or moving lower in your abdomen.  You may have   a bloody mucus discharge. This usually occurs a few days to a week before labor begins.  Your cervix becomes thin and soft (effaced) near your due date. WHAT TO EXPECT AT YOUR PRENATAL EXAMS  You will have prenatal exams every 2 weeks until week 36. Then, you will have weekly prenatal exams. During a routine prenatal visit:  You will be weighed to make sure you and the fetus are growing normally.  Your blood pressure is taken.  Your abdomen will be measured to track your baby's growth.  The fetal heartbeat will be listened to.  Any test results from the previous visit will be discussed.  You may have a cervical check near your due date to  see if you have effaced. At around 36 weeks, your caregiver will check your cervix. At the same time, your caregiver will also perform a test on the secretions of the vaginal tissue. This test is to determine if a type of bacteria, Group B streptococcus, is present. Your caregiver will explain this further. Your caregiver may ask you:  What your birth plan is.  How you are feeling.  If you are feeling the baby move.  If you have had any abnormal symptoms, such as leaking fluid, bleeding, severe headaches, or abdominal cramping.  If you have any questions. Other tests or screenings that may be performed during your third trimester include:  Blood tests that check for low iron levels (anemia).  Fetal testing to check the health, activity level, and growth of the fetus. Testing is done if you have certain medical conditions or if there are problems during the pregnancy. FALSE LABOR You may feel small, irregular contractions that eventually go away. These are called Braxton Hicks contractions, or false labor. Contractions may last for hours, days, or even weeks before true labor sets in. If contractions come at regular intervals, intensify, or become painful, it is best to be seen by your caregiver.  SIGNS OF LABOR   Menstrual-like cramps.  Contractions that are 5 minutes apart or less.  Contractions that start on the top of the uterus and spread down to the lower abdomen and back.  A sense of increased pelvic pressure or back pain.  A watery or bloody mucus discharge that comes from the vagina. If you have any of these signs before the 37th week of pregnancy, call your caregiver right away. You need to go to the hospital to get checked immediately. HOME CARE INSTRUCTIONS   Avoid all smoking, herbs, alcohol, and unprescribed drugs. These chemicals affect the formation and growth of the baby.  Follow your caregiver's instructions regarding medicine use. There are medicines that are  either safe or unsafe to take during pregnancy.  Exercise only as directed by your caregiver. Experiencing uterine cramps is a good sign to stop exercising.  Continue to eat regular, healthy meals.  Wear a good support bra for breast tenderness.  Do not use hot tubs, steam rooms, or saunas.  Wear your seat belt at all times when driving.  Avoid raw meat, uncooked cheese, cat litter boxes, and soil used by cats. These carry germs that can cause birth defects in the baby.  Take your prenatal vitamins.  Try taking a stool softener (if your caregiver approves) if you develop constipation. Eat more high-fiber foods, such as fresh vegetables or fruit and whole grains. Drink plenty of fluids to keep your urine clear or pale yellow.  Take warm sitz baths to soothe any pain or discomfort caused   by hemorrhoids. Use hemorrhoid cream if your caregiver approves.  If you develop varicose veins, wear support hose. Elevate your feet for 15 minutes, 3-4 times a day. Limit salt in your diet.  Avoid heavy lifting, wear low heal shoes, and practice good posture.  Rest a lot with your legs elevated if you have leg cramps or low back pain.  Visit your dentist if you have not gone during your pregnancy. Use a soft toothbrush to brush your teeth and be gentle when you floss.  A sexual relationship may be continued unless your caregiver directs you otherwise.  Do not travel far distances unless it is absolutely necessary and only with the approval of your caregiver.  Take prenatal classes to understand, practice, and ask questions about the labor and delivery.  Make a trial run to the hospital.  Pack your hospital bag.  Prepare the baby's nursery.  Continue to go to all your prenatal visits as directed by your caregiver. SEEK MEDICAL CARE IF:  You are unsure if you are in labor or if your water has broken.  You have dizziness.  You have mild pelvic cramps, pelvic pressure, or nagging pain in  your abdominal area.  You have persistent nausea, vomiting, or diarrhea.  You have a bad smelling vaginal discharge.  You have pain with urination. SEEK IMMEDIATE MEDICAL CARE IF:   You have a fever.  You are leaking fluid from your vagina.  You have spotting or bleeding from your vagina.  You have severe abdominal cramping or pain.  You have rapid weight loss or gain.  You have shortness of breath with chest pain.  You notice sudden or extreme swelling of your face, hands, ankles, feet, or legs.  You have not felt your baby move in over an hour.  You have severe headaches that do not go away with medicine.  You have vision changes. Document Released: 05/04/2001 Document Revised: 05/15/2013 Document Reviewed: 07/11/2012 ExitCare Patient Information 2015 [employer]. This information is not intended to replace advice given to you by your health care provider. Make sure you discuss any questions you have with your health care provider.   

## 2016-11-03 NOTE — Progress Notes (Signed)
Low-risk OB appointment [id number] 2085w2d Estimated Date of Delivery: 01/24/17 BP 100/70   Pulse 80   Wt 164 lb 4.8 oz (74.5 kg)   BMI 26.52 kg/m   BP, weight, and urine reviewed.  Refer to obstetrical flow sheet for FH & FHR.  Reports good fm.  Denies regular uc's, lof, vb, or uti s/s. No complaints. Was not scheduled for PN2 or f/u u/s for CP cyst. Desires in-hospital BTL, discussed risks/benefits, consent signed today.  Reviewed ptl s/s, fkc. Recommended Tdap at HD/PCP per CDC guidelines.  Plan:  Continue routine obstetrical care  F/U asap for pn2, efw/afi (for s<d) and f/u CP cyst u/s (no visit), then 4wks for OB appointment

## 2016-11-11 ENCOUNTER — Ambulatory Visit (INDEPENDENT_AMBULATORY_CARE_PROVIDER_SITE_OTHER): Payer: Medicaid Other

## 2016-11-11 ENCOUNTER — Other Ambulatory Visit: Payer: Medicaid Other

## 2016-11-11 DIAGNOSIS — O350XX0 Maternal care for (suspected) central nervous system malformation in fetus, not applicable or unspecified: Secondary | ICD-10-CM | POA: Diagnosis not present

## 2016-11-11 DIAGNOSIS — O26843 Uterine size-date discrepancy, third trimester: Secondary | ICD-10-CM

## 2016-11-11 DIAGNOSIS — Z3402 Encounter for supervision of normal first pregnancy, second trimester: Secondary | ICD-10-CM

## 2016-11-11 DIAGNOSIS — Z131 Encounter for screening for diabetes mellitus: Secondary | ICD-10-CM

## 2016-11-11 DIAGNOSIS — Z3A29 29 weeks gestation of pregnancy: Secondary | ICD-10-CM

## 2016-11-11 DIAGNOSIS — Z3482 Encounter for supervision of other normal pregnancy, second trimester: Secondary | ICD-10-CM

## 2016-11-11 DIAGNOSIS — IMO0001 Reserved for inherently not codable concepts without codable children: Secondary | ICD-10-CM

## 2016-11-11 NOTE — Progress Notes (Signed)
US 29+3 wks,cephalic,resolved choroid plexus cyst,cx 3.5 cm,normal ovaries bilat,post pl gr 0,afi 11.5 cm,fhr 133 bpm,efw 1571 g 66%

## 2016-11-12 LAB — HIV ANTIBODY (ROUTINE TESTING W REFLEX): HIV SCREEN 4TH GENERATION: NONREACTIVE

## 2016-11-12 LAB — CBC
HEMOGLOBIN: 11.5 g/dL (ref 11.1–15.9)
Hematocrit: 34.5 % (ref 34.0–46.6)
MCH: 33.8 pg — AB (ref 26.6–33.0)
MCHC: 33.3 g/dL (ref 31.5–35.7)
MCV: 102 fL — ABNORMAL HIGH (ref 79–97)
Platelets: 393 10*3/uL — ABNORMAL HIGH (ref 150–379)
RBC: 3.4 x10E6/uL — AB (ref 3.77–5.28)
RDW: 12.7 % (ref 12.3–15.4)
WBC: 15.7 10*3/uL — ABNORMAL HIGH (ref 3.4–10.8)

## 2016-11-12 LAB — ANTIBODY SCREEN: ANTIBODY SCREEN: NEGATIVE

## 2016-11-12 LAB — GLUCOSE TOLERANCE, 2 HOURS W/ 1HR
GLUCOSE, 1 HOUR: 130 mg/dL (ref 65–179)
GLUCOSE, 2 HOUR: 103 mg/dL (ref 65–152)
GLUCOSE, FASTING: 84 mg/dL (ref 65–91)

## 2016-11-12 LAB — RPR: RPR Ser Ql: NONREACTIVE

## 2016-11-30 ENCOUNTER — Other Ambulatory Visit: Payer: Self-pay | Admitting: Orthopedic Surgery

## 2016-11-30 DIAGNOSIS — S60551A Superficial foreign body of right hand, initial encounter: Secondary | ICD-10-CM

## 2016-12-01 ENCOUNTER — Ambulatory Visit (INDEPENDENT_AMBULATORY_CARE_PROVIDER_SITE_OTHER): Payer: Medicaid Other | Admitting: Advanced Practice Midwife

## 2016-12-01 ENCOUNTER — Encounter: Payer: Self-pay | Admitting: Advanced Practice Midwife

## 2016-12-01 VITALS — BP 102/50 | HR 84 | Wt 168.0 lb

## 2016-12-01 DIAGNOSIS — Z3483 Encounter for supervision of other normal pregnancy, third trimester: Secondary | ICD-10-CM

## 2016-12-01 DIAGNOSIS — Z3A32 32 weeks gestation of pregnancy: Secondary | ICD-10-CM

## 2016-12-01 DIAGNOSIS — Z1389 Encounter for screening for other disorder: Secondary | ICD-10-CM

## 2016-12-01 DIAGNOSIS — Z331 Pregnant state, incidental: Secondary | ICD-10-CM

## 2016-12-01 LAB — POCT URINALYSIS DIPSTICK
Blood, UA: NEGATIVE
GLUCOSE UA: NEGATIVE
KETONES UA: NEGATIVE
Nitrite, UA: NEGATIVE
PROTEIN UA: NEGATIVE

## 2016-12-01 NOTE — Progress Notes (Signed)
[id number] 243w2d Estimated Date of Delivery: 01/24/17  Blood pressure (!) 102/50, pulse 84, weight 168 lb (76.2 kg).   BP weight and urine results all reviewed and noted.  Please refer to the obstetrical flow sheet for the fundal height and fetal heart rate documentation Size still <dates, but: EFW 66% a few weeks ago  Patient reports good fetal movement, denies any bleeding and no rupture of membranes symptoms or regular contractions. Patient is without complaints. All questions were answered.  Orders Placed This Encounter  Procedures  . POCT urinalysis dipstick    Plan:  Continued routine obstetrical care,   Return in about 2 weeks (around 12/15/2016) for LROB.

## 2016-12-15 ENCOUNTER — Ambulatory Visit (INDEPENDENT_AMBULATORY_CARE_PROVIDER_SITE_OTHER): Payer: Medicaid Other | Admitting: Obstetrics and Gynecology

## 2016-12-15 ENCOUNTER — Encounter: Payer: Self-pay | Admitting: Obstetrics and Gynecology

## 2016-12-15 VITALS — BP 98/56 | HR 88 | Wt 169.2 lb

## 2016-12-15 DIAGNOSIS — Z3A34 34 weeks gestation of pregnancy: Secondary | ICD-10-CM

## 2016-12-15 DIAGNOSIS — Z3483 Encounter for supervision of other normal pregnancy, third trimester: Secondary | ICD-10-CM

## 2016-12-15 DIAGNOSIS — Z331 Pregnant state, incidental: Secondary | ICD-10-CM

## 2016-12-15 DIAGNOSIS — Z1389 Encounter for screening for other disorder: Secondary | ICD-10-CM

## 2016-12-15 LAB — POCT URINALYSIS DIPSTICK
Blood, UA: NEGATIVE
Glucose, UA: NEGATIVE
Ketones, UA: NEGATIVE
NITRITE UA: NEGATIVE
PROTEIN UA: NEGATIVE

## 2016-12-15 NOTE — Progress Notes (Signed)
[id number]  [facility]  Chief Complaint  Patient presents with  . Routine Prenatal Visit  ____  Patient complaints:None except minor hand swelling, no significant change. Patient reports  good fetal movement,                           denies any bleeding , rupture of membranes,or regular contractions.  Blood pressure (!) 98/56, pulse 88, weight 169 lb 3.2 oz (76.7 kg).   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 Assessment: LROB [id number] @ 7657w2d low risk OB, no acute problems, planning in-hospital tubal ligation with paperwork completed for consent  Plan:  Continued routine obstetrical care, 2 weeks LR OB  F/u in 2 weeks for LROB

## 2016-12-29 ENCOUNTER — Ambulatory Visit (INDEPENDENT_AMBULATORY_CARE_PROVIDER_SITE_OTHER): Payer: Medicaid Other | Admitting: Women's Health

## 2016-12-29 ENCOUNTER — Encounter: Payer: Self-pay | Admitting: Women's Health

## 2016-12-29 VITALS — BP 96/54 | HR 68 | Wt 173.0 lb

## 2016-12-29 DIAGNOSIS — Z1389 Encounter for screening for other disorder: Secondary | ICD-10-CM

## 2016-12-29 DIAGNOSIS — Z331 Pregnant state, incidental: Secondary | ICD-10-CM

## 2016-12-29 DIAGNOSIS — Z3A36 36 weeks gestation of pregnancy: Secondary | ICD-10-CM

## 2016-12-29 DIAGNOSIS — Z3483 Encounter for supervision of other normal pregnancy, third trimester: Secondary | ICD-10-CM

## 2016-12-29 LAB — POCT URINALYSIS DIPSTICK
Glucose, UA: NEGATIVE
Ketones, UA: NEGATIVE
NITRITE UA: NEGATIVE
Protein, UA: NEGATIVE
RBC UA: NEGATIVE

## 2016-12-29 NOTE — Patient Instructions (Addendum)
Call the office [phone]) or [facility] if:  You begin to have strong, frequent contractions  Your water breaks.  Sometimes it is a big gush of fluid, sometimes it is just a trickle that keeps getting your panties wet or running down your legs  You have vaginal bleeding.  It is normal to have a small amount of spotting if your cervix was checked.   You don't feel your baby moving like normal.  If you don't, get you something to eat and drink and lay down and focus on feeling your baby move.  You should feel at least 10 movements in 2 hours.  If you don't, you should call the office or [facility].    [place] [facility]/Family Doctors:  [facility] [phone]            [facility] [phone]                 [facility] [phone] (usually not accepting new patients unless you have family there already, you are always welcome to call and ask)            [facility]:   [facility]: [phone]  [facility]: [phone]    Deberah PeltonBraxton Hicks Contractions Contractions of the uterus can occur throughout pregnancy, but they are not always a sign that you are in labor. You may have practice contractions called Braxton Hicks contractions. These false labor contractions are sometimes confused with true labor. What are Deberah PeltonBraxton Hicks contractions? Braxton Hicks contractions are tightening movements that occur in the muscles of the uterus before labor. Unlike true labor contractions, these contractions do not result in opening (dilation) and thinning of the cervix. Toward the end of pregnancy (32-34 weeks), Braxton Hicks contractions can happen more often and may become stronger. These contractions are sometimes difficult to tell apart from true labor because they can be very uncomfortable. You should not feel embarrassed if you go to the hospital with false labor. Sometimes, the  only way to tell if you are in true labor is for your health care provider to look for changes in the cervix. The health care provider will do a physical exam and may monitor your contractions. If you are not in true labor, the exam should show that your cervix is not dilating and your water has not broken. If there are no prenatal problems or other health problems associated with your pregnancy, it is completely safe for you to be sent home with false labor. You may continue to have Braxton Hicks contractions until you go into true labor. How can I tell the difference between true labor and false labor?  Differences ? False labor ? Contractions last 30-70 seconds.: Contractions are usually shorter and not as strong as true labor contractions. ? Contractions become very regular.: Contractions are usually irregular. ? Discomfort is usually felt in the top of the uterus, and it spreads to the lower abdomen and low back.: Contractions are often felt in the front of the lower abdomen and in the groin. ? Contractions do not go away with walking.: Contractions may go away when you walk around or change positions while lying down. ? Contractions usually become more intense and increase in frequency.: Contractions get weaker and are shorter-lasting as time goes on. ? The cervix dilates and gets thinner.: The cervix usually does not dilate or become thin. Follow these instructions at home:  Take over-the-counter and prescription medicines only as told  told by your health care provider.  Keep up with your usual exercises and follow other instructions from your health care provider.  Eat and drink lightly if you think you are going into labor.  If Braxton Hicks contractions are making you uncomfortable: ? Change your position from lying down or resting to walking, or change from walking to resting. ? Sit and rest in a tub of warm water. ? Drink enough fluid to keep your urine clear or pale yellow.  Dehydration may cause these contractions. ? Do slow and deep breathing several times an hour.  Keep all follow-up prenatal visits as told by your health care provider. This is important. Contact a health care provider if:  You have a fever.  You have continuous pain in your abdomen. Get help right away if:  Your contractions become stronger, more regular, and closer together.  You have fluid leaking or gushing from your vagina.  You pass blood-tinged mucus (bloody show).  You have bleeding from your vagina.  You have low back pain that you never had before.  You feel your baby's head pushing down and causing pelvic pressure.  Your baby is not moving inside you as much as it used to. Summary  Contractions that occur before labor are called Braxton Hicks contractions, false labor, or practice contractions.  Braxton Hicks contractions are usually shorter, weaker, farther apart, and less regular than true labor contractions. True labor contractions usually become progressively stronger and regular and they become more frequent.  Manage discomfort from Braxton Hicks contractions by changing position, resting in a warm bath, drinking plenty of water, or practicing deep breathing. This information is not intended to replace advice given to you by your health care provider. Make sure you discuss any questions you have with your health care provider. Document Released: 05/10/2005 Document Revised: 03/29/2016 Document Reviewed: 03/29/2016 Elsevier Interactive Patient Education  2017 [employer]  

## 2016-12-29 NOTE — Progress Notes (Signed)
Low-risk OB appointment [id number] 3359w2d Estimated Date of Delivery: 01/24/17 BP (!) 96/54   Pulse 68   Wt 173 lb (78.5 kg)   BMI 27.92 kg/m   BP, weight, and urine reviewed.  Refer to obstetrical flow sheet for FH & FHR.  Reports good fm.  Denies regular uc's, lof, vb, or uti s/s. Swelling in legs, 1+ BLE edema, elevate legs, can get compression stockings if they are bothering her.  Reviewed ptl s/s, fkc. Plan:  Continue routine obstetrical care  F/U in 1wk for OB appointment and gbs

## 2017-01-06 ENCOUNTER — Encounter: Payer: Self-pay | Admitting: Advanced Practice Midwife

## 2017-01-06 ENCOUNTER — Ambulatory Visit (INDEPENDENT_AMBULATORY_CARE_PROVIDER_SITE_OTHER): Payer: Medicaid Other | Admitting: Advanced Practice Midwife

## 2017-01-06 VITALS — BP 110/68 | HR 70 | Wt 176.0 lb

## 2017-01-06 DIAGNOSIS — Z1389 Encounter for screening for other disorder: Secondary | ICD-10-CM

## 2017-01-06 DIAGNOSIS — Z3483 Encounter for supervision of other normal pregnancy, third trimester: Secondary | ICD-10-CM

## 2017-01-06 DIAGNOSIS — O09893 Supervision of other high risk pregnancies, third trimester: Secondary | ICD-10-CM

## 2017-01-06 DIAGNOSIS — Z331 Pregnant state, incidental: Secondary | ICD-10-CM

## 2017-01-06 DIAGNOSIS — Z3A37 37 weeks gestation of pregnancy: Secondary | ICD-10-CM

## 2017-01-06 DIAGNOSIS — O26843 Uterine size-date discrepancy, third trimester: Secondary | ICD-10-CM

## 2017-01-06 LAB — POCT URINALYSIS DIPSTICK
Blood, UA: NEGATIVE
Glucose, UA: NEGATIVE
KETONES UA: NEGATIVE
Nitrite, UA: NEGATIVE
Protein, UA: NEGATIVE

## 2017-01-06 NOTE — Progress Notes (Signed)
[id number] 5875w3d Estimated Date of Delivery: 01/24/17  Blood pressure 110/68, pulse 70, weight 176 lb (79.8 kg).   BP weight and urine results all reviewed and noted.  Please refer to the obstetrical flow sheet for the fundal height and fetal heart rate documentation:  Size < dates  Patient reports good fetal movement, denies any bleeding and no rupture of membranes symptoms or regular contractions. Patient is without complaints. All questions were answered.  Orders Placed This Encounter  Procedures  . Culture, beta strep (group b only)  . GC/Chlamydia Probe Amp  . US OB Follow Up  . POCT urinalysis dipstick    Plan:  Continued routine obstetrical care,   Return for asap for EFW/AFI .

## 2017-01-06 NOTE — Patient Instructions (Signed)

## 2017-01-07 ENCOUNTER — Ambulatory Visit (INDEPENDENT_AMBULATORY_CARE_PROVIDER_SITE_OTHER): Payer: Medicaid Other

## 2017-01-07 DIAGNOSIS — Z3A37 37 weeks gestation of pregnancy: Secondary | ICD-10-CM

## 2017-01-07 DIAGNOSIS — O26843 Uterine size-date discrepancy, third trimester: Secondary | ICD-10-CM

## 2017-01-07 DIAGNOSIS — Z3403 Encounter for supervision of normal first pregnancy, third trimester: Secondary | ICD-10-CM

## 2017-01-07 NOTE — Progress Notes (Signed)
[place] 37+4 wks,cephalic,post pl gr 2,normal ovaries bilat,afi 13.9 cm,fhr 130 bpm,efw 3253 g 58%

## 2017-01-09 LAB — GC/CHLAMYDIA PROBE AMP
CHLAMYDIA, DNA PROBE: NEGATIVE
NEISSERIA GONORRHOEAE BY PCR: NEGATIVE

## 2017-01-10 LAB — CULTURE, BETA STREP (GROUP B ONLY): STREP GP B CULTURE: NEGATIVE

## 2017-01-13 ENCOUNTER — Encounter: Payer: Self-pay | Admitting: Obstetrics and Gynecology

## 2017-01-13 ENCOUNTER — Ambulatory Visit (INDEPENDENT_AMBULATORY_CARE_PROVIDER_SITE_OTHER): Payer: Medicaid Other | Admitting: Obstetrics and Gynecology

## 2017-01-13 VITALS — BP 80/60 | HR 62 | Wt 170.6 lb

## 2017-01-13 DIAGNOSIS — Z3A38 38 weeks gestation of pregnancy: Secondary | ICD-10-CM

## 2017-01-13 DIAGNOSIS — Z3403 Encounter for supervision of normal first pregnancy, third trimester: Secondary | ICD-10-CM

## 2017-01-13 DIAGNOSIS — Z331 Pregnant state, incidental: Secondary | ICD-10-CM

## 2017-01-13 DIAGNOSIS — Z1389 Encounter for screening for other disorder: Secondary | ICD-10-CM

## 2017-01-13 LAB — POCT URINALYSIS DIPSTICK
Blood, UA: NEGATIVE
GLUCOSE UA: NEGATIVE
Nitrite, UA: NEGATIVE

## 2017-01-13 NOTE — Progress Notes (Addendum)
Patricia Jefferson is a 31 y.o. female [id number]  Estimated Date of Delivery: 01/24/17 LROB [redacted]w[redacted]d  Chief Complaint  Patient presents with  . Routine Prenatal Visit    want to be check  ____  Patient has no complaints today. She reports more strong cramping recently. 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  Patient reports  good fetal movement,                           denies any bleeding , rupture of membranes,or regular contractions.  Blood pressure (!) 80/60, pulse 62, weight 170 lb 9.6 oz (77.4 kg).   Urine results:notable for trace protein, 2+ leukocytes, large ketones refer to the ob flow sheet for FH and FHR, ,                          Physical Examination: General appearance - alert, well appearing, and in no distress                                      Abdomen - FH 36                                                        -FHR 169                                                         soft, nontender, nondistended, no masses or organomegaly                                      Pelvic - 1 cm, head is down against cervix                                            Questions were answered. AssessmentJanett Jefferson [id number] @ [redacted]w[redacted]d Estimated Date of Delivery: 01/24/17   Plan:  Continued routine obstetrical care, LROB, write a maternity note effective on 01/17/17.  F/u in 1 weeks for LROB   By signing my name below, I, Izna Ahmed, attest that this documentation has been prepared under the direction and in the presence of Tilda Burrow, MD. Electronically Signed: Redge Gainer, Medical Scribe. 01/13/17. 12:24 PM.  I personally performed the services described in this documentation, which was SCRIBED in my presence. The recorded information has been reviewed and considered accurate. It has been edited as necessary during review. Tilda Burrow, MD

## 2017-01-20 ENCOUNTER — Encounter: Payer: Self-pay | Admitting: Women's Health

## 2017-01-20 ENCOUNTER — Ambulatory Visit (INDEPENDENT_AMBULATORY_CARE_PROVIDER_SITE_OTHER): Payer: Medicaid Other | Admitting: Women's Health

## 2017-01-20 VITALS — BP 90/58 | HR 82 | Wt 174.0 lb

## 2017-01-20 DIAGNOSIS — Z1389 Encounter for screening for other disorder: Secondary | ICD-10-CM

## 2017-01-20 DIAGNOSIS — O480 Post-term pregnancy: Secondary | ICD-10-CM | POA: Diagnosis not present

## 2017-01-20 DIAGNOSIS — Z3A39 39 weeks gestation of pregnancy: Secondary | ICD-10-CM | POA: Diagnosis not present

## 2017-01-20 DIAGNOSIS — Z331 Pregnant state, incidental: Secondary | ICD-10-CM

## 2017-01-20 DIAGNOSIS — Z3483 Encounter for supervision of other normal pregnancy, third trimester: Secondary | ICD-10-CM

## 2017-01-20 LAB — POCT URINALYSIS DIPSTICK
GLUCOSE UA: NEGATIVE
Ketones, UA: NEGATIVE
Nitrite, UA: NEGATIVE
RBC UA: NEGATIVE

## 2017-01-20 NOTE — Patient Instructions (Signed)
Call the office ([phone]) or go to [facility] if:  You begin to have strong, frequent contractions  Your water breaks.  Sometimes it is a big gush of fluid, sometimes it is just a trickle that keeps getting your panties wet or running down your legs  You have vaginal bleeding.  It is normal to have a small amount of spotting if your cervix was checked.   You don't feel your baby moving like normal.  If you don't, get you something to eat and drink and lay down and focus on feeling your baby move.  You should feel at least 10 movements in 2 hours.  If you don't, you should call the office or go to [facility].     Braxton Hicks Contractions Contractions of the uterus can occur throughout pregnancy, but they are not always a sign that you are in labor. You may have practice contractions called Braxton Hicks contractions. These false labor contractions are sometimes confused with true labor. What are Braxton Hicks contractions? Braxton Hicks contractions are tightening movements that occur in the muscles of the uterus before labor. Unlike true labor contractions, these contractions do not result in opening (dilation) and thinning of the cervix. Toward the end of pregnancy (32-34 weeks), Braxton Hicks contractions can happen more often and may become stronger. These contractions are sometimes difficult to tell apart from true labor because they can be very uncomfortable. You should not feel embarrassed if you go to the hospital with false labor. Sometimes, the only way to tell if you are in true labor is for your health care provider to look for changes in the cervix. The health care provider will do a physical exam and may monitor your contractions. If you are not in true labor, the exam should show that your cervix is not dilating and your water has not broken. If there are no prenatal problems or other health problems associated with your pregnancy, it is completely safe for you to be sent  home with false labor. You may continue to have Braxton Hicks contractions until you go into true labor. How can I tell the difference between true labor and false labor?  Differences ? False labor ? Contractions last 30-70 seconds.: Contractions are usually shorter and not as strong as true labor contractions. ? Contractions become very regular.: Contractions are usually irregular. ? Discomfort is usually felt in the top of the uterus, and it spreads to the lower abdomen and low back.: Contractions are often felt in the front of the lower abdomen and in the groin. ? Contractions do not go away with walking.: Contractions may go away when you walk around or change positions while lying down. ? Contractions usually become more intense and increase in frequency.: Contractions get weaker and are shorter-lasting as time goes on. ? The cervix dilates and gets thinner.: The cervix usually does not dilate or become thin. Follow these instructions at home:  Take over-the-counter and prescription medicines only as told by your health care provider.  Keep up with your usual exercises and follow other instructions from your health care provider.  Eat and drink lightly if you think you are going into labor.  If Braxton Hicks contractions are making you uncomfortable: ? Change your position from lying down or resting to walking, or change from walking to resting. ? Sit and rest in a tub of warm water. ? Drink enough fluid to keep your urine clear or pale yellow. Dehydration may cause these contractions. ?   Do slow and deep breathing several times an hour.  Keep all follow-up prenatal visits as told by your health care provider. This is important. Contact a health care provider if:  You have a fever.  You have continuous pain in your abdomen. Get help right away if:  Your contractions become stronger, more regular, and closer together.  You have fluid leaking or gushing from your vagina.  You  pass blood-tinged mucus (bloody show).  You have bleeding from your vagina.  You have low back pain that you never had before.  You feel your baby's head pushing down and causing pelvic pressure.  Your baby is not moving inside you as much as it used to. Summary  Contractions that occur before labor are called Braxton Hicks contractions, false labor, or practice contractions.  Braxton Hicks contractions are usually shorter, weaker, farther apart, and less regular than true labor contractions. True labor contractions usually become progressively stronger and regular and they become more frequent.  Manage discomfort from Braxton Hicks contractions by changing position, resting in a warm bath, drinking plenty of water, or practicing deep breathing. This information is not intended to replace advice given to you by your health care provider. Make sure you discuss any questions you have with your health care provider. Document Released: 05/10/2005 Document Revised: 03/29/2016 Document Reviewed: 03/29/2016 Elsevier Interactive Patient Education  2017 Elsevier Inc.  

## 2017-01-20 NOTE — Progress Notes (Signed)
Low-risk OB appointment [id number] Estimated Date of Delivery: 01/24/17 BP (!) 90/58   Pulse 82   Wt 174 lb (78.9 kg)   BMI 28.08 kg/m   BP, weight, and urine reviewed.  Refer to obstetrical flow sheet for FH & FHR.  Reports good fm.  Denies regular uc's, lof, vb, or uti s/s. No complaints. SVE per request: 1.5/th/-2, vtx Reviewed labor s/s, fkc. Plan:  Continue routine obstetrical care  F/U in 1wk for OB appointment and bpp u/s d/t >40wks S<D, EFW 58% w/ AFI 13.9cm 2wks ago for same

## 2017-01-27 ENCOUNTER — Inpatient Hospital Stay (HOSPITAL_COMMUNITY): Payer: BLUE CROSS/BLUE SHIELD | Admitting: Anesthesiology

## 2017-01-27 ENCOUNTER — Telehealth: Payer: Self-pay | Admitting: *Deleted

## 2017-01-27 ENCOUNTER — Inpatient Hospital Stay (HOSPITAL_COMMUNITY)
Admission: AD | Admit: 2017-01-27 | Discharge: 2017-01-29 | DRG: 775 | Disposition: A | Discharge status: Home / Self Care | Payer: BLUE CROSS/BLUE SHIELD | Source: Ambulatory Visit | Attending: Obstetrics and Gynecology | Admitting: Obstetrics and Gynecology

## 2017-01-27 ENCOUNTER — Encounter (HOSPITAL_COMMUNITY): Payer: Self-pay | Admitting: *Deleted

## 2017-01-27 DIAGNOSIS — F17200 Nicotine dependence, unspecified, uncomplicated: Secondary | ICD-10-CM

## 2017-01-27 DIAGNOSIS — Z3A40 40 weeks gestation of pregnancy: Secondary | ICD-10-CM

## 2017-01-27 DIAGNOSIS — O99334 Smoking (tobacco) complicating childbirth: Secondary | ICD-10-CM | POA: Diagnosis present

## 2017-01-27 DIAGNOSIS — Z3483 Encounter for supervision of other normal pregnancy, third trimester: Secondary | ICD-10-CM

## 2017-01-27 DIAGNOSIS — O4292 Full-term premature rupture of membranes, unspecified as to length of time between rupture and onset of labor: Secondary | ICD-10-CM | POA: Diagnosis present

## 2017-01-27 DIAGNOSIS — O26843 Uterine size-date discrepancy, third trimester: Secondary | ICD-10-CM | POA: Diagnosis present

## 2017-01-27 DIAGNOSIS — F17210 Nicotine dependence, cigarettes, uncomplicated: Secondary | ICD-10-CM | POA: Diagnosis present

## 2017-01-27 DIAGNOSIS — Q8901 Asplenia (congenital): Secondary | ICD-10-CM

## 2017-01-27 DIAGNOSIS — O4202 Full-term premature rupture of membranes, onset of labor within 24 hours of rupture: Secondary | ICD-10-CM

## 2017-01-27 DIAGNOSIS — Z9049 Acquired absence of other specified parts of digestive tract: Secondary | ICD-10-CM | POA: Diagnosis not present

## 2017-01-27 DIAGNOSIS — O4290 Premature rupture of membranes, unspecified as to length of time between rupture and onset of labor, unspecified weeks of gestation: Secondary | ICD-10-CM | POA: Diagnosis present

## 2017-01-27 HISTORY — DX: Laceration of liver, unspecified degree, initial encounter: S36.113A

## 2017-01-27 HISTORY — DX: Traumatic pneumothorax, initial encounter: S27.0XXA

## 2017-01-27 HISTORY — DX: Unspecified laceration of spleen, initial encounter: S36.039A

## 2017-01-27 LAB — RAPID URINE DRUG SCREEN, HOSP PERFORMED
Amphetamines: NOT DETECTED
Barbiturates: NOT DETECTED
Benzodiazepines: NOT DETECTED
Cocaine: NOT DETECTED
Opiates: NOT DETECTED
Tetrahydrocannabinol: NOT DETECTED

## 2017-01-27 LAB — COMPREHENSIVE METABOLIC PANEL
ALBUMIN: 2.7 g/dL — AB (ref 3.5–5.0)
ALT: 11 U/L — ABNORMAL LOW (ref 14–54)
ANION GAP: 10 (ref 5–15)
AST: 20 U/L (ref 15–41)
Alkaline Phosphatase: 179 U/L — ABNORMAL HIGH (ref 38–126)
BUN: 8 mg/dL (ref 6–20)
CO2: 21 mmol/L — AB (ref 22–32)
Calcium: 8.4 mg/dL — ABNORMAL LOW (ref 8.9–10.3)
Chloride: 104 mmol/L (ref 101–111)
Creatinine, Ser: 0.47 mg/dL (ref 0.44–1.00)
GFR calc Af Amer: >60 mL/min (ref >60)
GFR calc non Af Amer: >60 mL/min (ref >60)
GLUCOSE: 98 mg/dL (ref 65–99)
POTASSIUM: 3.7 mmol/L (ref 3.5–5.1)
SODIUM: 135 mmol/L (ref 135–145)
Total Bilirubin: 0.4 mg/dL (ref 0.3–1.2)
Total Protein: 6.2 g/dL — ABNORMAL LOW (ref 6.5–8.1)

## 2017-01-27 LAB — PROTEIN / CREATININE RATIO, URINE
CREATININE, URINE: 11 mg/dL
Protein Creatinine Ratio: 0.73 mg/mg{Cre} — ABNORMAL HIGH (ref 0.00–0.15)
Total Protein, Urine: 8 mg/dL

## 2017-01-27 LAB — CBC
HCT: 32.1 % — ABNORMAL LOW (ref 36.0–46.0)
Hemoglobin: 10.8 g/dL — ABNORMAL LOW (ref 12.0–15.0)
MCH: 30.6 pg (ref 26.0–34.0)
MCHC: 33.6 g/dL (ref 30.0–36.0)
MCV: 90.9 fL (ref 78.0–100.0)
PLATELETS: 321 10*3/uL (ref 150–400)
RBC: 3.53 MIL/uL — AB (ref 3.87–5.11)
RDW: 14 % (ref 11.5–15.5)
WBC: 16.7 10*3/uL — AB (ref 4.0–10.5)

## 2017-01-27 LAB — TYPE AND SCREEN
ABO/RH(D): O POS
Antibody Screen: NEGATIVE

## 2017-01-27 LAB — ABO/RH: ABO/RH(D): O POS

## 2017-01-27 MED ORDER — LACTATED RINGERS IV SOLN
INTRAVENOUS | Status: DC
  Administered 2017-01-27 (×2): via INTRAVENOUS

## 2017-01-27 MED ORDER — ACETAMINOPHEN 325 MG PO TABS
650.0000 mg | ORAL_TABLET | ORAL | Status: DC | PRN
  Filled 2017-01-27: qty 2

## 2017-01-27 MED ORDER — FENTANYL CITRATE (PF) 100 MCG/2ML IJ SOLN
100.0000 ug | INTRAMUSCULAR | Status: DC | PRN
  Administered 2017-01-27: 100 ug via INTRAVENOUS
  Filled 2017-01-27: qty 2

## 2017-01-27 MED ORDER — LACTATED RINGERS IV SOLN: 500.0000 mL | INTRAVENOUS | Status: DC | PRN

## 2017-01-27 MED ORDER — EPHEDRINE 5 MG/ML INJ: 10.0000 mg | INTRAVENOUS | Status: DC | PRN

## 2017-01-27 MED ORDER — MISOPROSTOL 25 MCG QUARTER TABLET
25.0000 ug | ORAL_TABLET | ORAL | Status: DC | PRN
  Administered 2017-01-27: 25 ug via VAGINAL
  Filled 2017-01-27 (×2): qty 1

## 2017-01-27 MED ORDER — IBUPROFEN 600 MG PO TABS
600.0000 mg | ORAL_TABLET | Freq: Four times a day (QID) | ORAL | Status: DC
  Administered 2017-01-28 – 2017-01-29 (×6): 600 mg via ORAL
  Filled 2017-01-27 (×6): qty 1

## 2017-01-27 MED ORDER — DIPHENHYDRAMINE HCL 50 MG/ML IJ SOLN: 12.5000 mg | INTRAMUSCULAR | Status: DC | PRN

## 2017-01-27 MED ORDER — OXYCODONE-ACETAMINOPHEN 5-325 MG PO TABS: 2.0000 | ORAL_TABLET | ORAL | Status: DC | PRN

## 2017-01-27 MED ORDER — TERBUTALINE SULFATE 1 MG/ML IJ SOLN
0.2500 mg | Freq: Once | INTRAMUSCULAR | Status: DC | PRN
  Filled 2017-01-27: qty 1

## 2017-01-27 MED ORDER — PHENYLEPHRINE 40 MCG/ML (10ML) SYRINGE FOR IV PUSH (FOR BLOOD PRESSURE SUPPORT): 80.0000 ug | PREFILLED_SYRINGE | INTRAVENOUS | Status: DC | PRN

## 2017-01-27 MED ORDER — LACTATED RINGERS IV SOLN
500.0000 mL | Freq: Once | INTRAVENOUS | Status: AC
  Administered 2017-01-27: 500 mL via INTRAVENOUS

## 2017-01-27 MED ORDER — LIDOCAINE HCL (PF) 1 % IJ SOLN
INTRAMUSCULAR | Status: DC | PRN
  Administered 2017-01-27 (×2): 4 mL

## 2017-01-27 MED ORDER — PHENYLEPHRINE 40 MCG/ML (10ML) SYRINGE FOR IV PUSH (FOR BLOOD PRESSURE SUPPORT)
80.0000 ug | PREFILLED_SYRINGE | INTRAVENOUS | Status: DC | PRN
  Filled 2017-01-27: qty 10

## 2017-01-27 MED ORDER — OXYCODONE-ACETAMINOPHEN 5-325 MG PO TABS: 1.0000 | ORAL_TABLET | ORAL | Status: DC | PRN

## 2017-01-27 MED ORDER — SOD CITRATE-CITRIC ACID 500-334 MG/5ML PO SOLN: 30.0000 mL | ORAL | Status: DC | PRN

## 2017-01-27 MED ORDER — ONDANSETRON HCL 4 MG/2ML IJ SOLN: 4.0000 mg | Freq: Four times a day (QID) | INTRAMUSCULAR | Status: DC | PRN

## 2017-01-27 MED ORDER — FENTANYL 2.5 MCG/ML BUPIVACAINE 1/10 % EPIDURAL INFUSION (WH - ANES)
14.0000 mL/h | INTRAMUSCULAR | Status: DC | PRN
  Administered 2017-01-27: 14 mL/h via EPIDURAL
  Filled 2017-01-27: qty 100

## 2017-01-27 MED ORDER — LIDOCAINE HCL (PF) 1 % IJ SOLN
30.0000 mL | INTRAMUSCULAR | Status: DC | PRN
  Filled 2017-01-27: qty 30

## 2017-01-27 MED ORDER — HYDROCODONE-ACETAMINOPHEN 5-325 MG PO TABS
1.0000 | ORAL_TABLET | Freq: Four times a day (QID) | ORAL | Status: DC | PRN
  Administered 2017-01-28 (×2): 1 via ORAL
  Filled 2017-01-27 (×2): qty 1

## 2017-01-27 MED ORDER — OXYTOCIN 40 UNITS IN LACTATED RINGERS INFUSION - SIMPLE MED
2.5000 [IU]/h | INTRAVENOUS | Status: DC
  Filled 2017-01-27: qty 1000

## 2017-01-27 MED ORDER — OXYTOCIN BOLUS FROM INFUSION
500.0000 mL | Freq: Once | INTRAVENOUS | Status: AC
  Administered 2017-01-27: 500 mL via INTRAVENOUS

## 2017-01-27 NOTE — Telephone Encounter (Signed)
Pt called stating that she has had 3 large gushes of fluid this morning since she woke up. She states she is not having any contractions and baby is moving well. [facility]. Pt verbalized understanding.

## 2017-01-27 NOTE — Anesthesia Procedure Notes (Signed)
Epidural Patient location during procedure: OB  Staffing Anesthesiologist: Lewie LoronGERMEROTH, Kashawna Manzer Performed: anesthesiologist   Preanesthetic Checklist Completed: patient identified, pre-op evaluation, timeout performed, IV checked, risks and benefits discussed and monitors and equipment checked  Epidural Patient position: sitting Prep: site prepped and draped and DuraPrep Patient monitoring: heart rate, blood pressure and continuous pulse ox Approach: midline Location: L2-L3 Injection technique: LOR air and LOR saline  Needle:  Needle type: Tuohy  Needle gauge: 17 G Needle length: 9 cm Needle insertion depth: 7 cm Catheter type: closed end flexible Catheter size: 19 Gauge Catheter at skin depth: 12 cm Test dose: negative  Assessment Sensory level: T8 Events: blood not aspirated, injection not painful, no injection resistance, negative IV test and no paresthesia  Additional Notes Reason for block:procedure for pain

## 2017-01-27 NOTE — Anesthesia Pain Management Evaluation Note (Signed)
  CRNA Pain Management Visit Note  Patient: Patricia MansonJessica A Jefferson, 31 y.o., female  "Hello I am a [facility] of the [facility]. We have an anesthesia team available at all times to provide care throughout the hospital, including epidural management and anesthesia for C-section. I don't know your plan for the delivery whether it a natural birth, water birth, IV sedation, nitrous supplementation, doula or epidural, but we want to meet your pain goals."   1.Was your pain managed to your expectations on prior hospitalizations?   Yes   2.What is your expectation for pain management during this hospitalization?     Epidural  3.How can we help you reach that goal? epidural Record the patient's initial score and the patient's pain goal.   Pain: 10/10 Pain Goal: 4/10 [facility] wants you to be able to say your pain was always managed very well.  Salome ArntSterling, Cherina Dhillon Marie 01/27/2017

## 2017-01-27 NOTE — Progress Notes (Signed)
  Patient Vitals for the past 6 hrs:  BP Temp Temp src Pulse Resp SpO2 Height Weight  01/27/17 1839 - 97.9 F (36.6 C) Oral - 18 - - -  01/27/17 1746 - - - - - 100 % - -  01/27/17 1742 - - - - - 100 % - -  01/27/17 1741 - - - - - 100 % - -  01/27/17 1736 - - - - - 100 % - -  01/27/17 1620 129/80 98.5 F (36.9 C) - 77 16 - - -  01/27/17 1552 (!) 135/55 98.4 F (36.9 C) Oral 69 16 - 5\' 6"  (1.676 m) 80.3 kg (177 lb)    Foley fell out a little while ago.  Using nitrous for pain.  Ctx q 2-3 minutes. Had cytotec at 1700.  Will assess for need for further augmentation at 2100.

## 2017-01-27 NOTE — Progress Notes (Signed)
Vitals:   01/27/17 2128 01/27/17 2131  BP: 123/78 132/81  Pulse: 73 76  Resp: 18   Temp: 99 F (37.2 C)   SpO2:     Comfortable w/epidural. Cx 7-8/100/-1.  No pressure yet. FHR Cat 1. Ctx q 2 minutes. Anticipate SVD soon

## 2017-01-27 NOTE — Anesthesia Preprocedure Evaluation (Signed)
Anesthesia Evaluation  Patient identified by MRN, date of birth, ID band Patient awake    Reviewed: Allergy & Precautions, NPO status , Patient's Chart, lab work & pertinent test results  History of Anesthesia Complications Negative for: history of anesthetic complications  Airway Mallampati: III  TM Distance: >3 FB Neck ROM: Full  Mouth opening: Limited Mouth Opening  Dental  (+) Dental Advisory Given, Teeth Intact   Pulmonary neg shortness of breath, neg sleep apnea, neg COPD, neg recent URI, Current Smoker,  Left pneumothorax, bilateral lung contusions   breath sounds clear to auscultation + decreased breath sounds (on left)      Cardiovascular (-) hypertension(-) angina(-) CAD, (-) Past MI, (-) Cardiac Stents and (-) CABG + dysrhythmias (prolonged QT)  Rhythm:Regular Rate:Normal     Neuro/Psych neg Seizures PSYCHIATRIC DISORDERS Anxiety Possible Chiari I malformation    GI/Hepatic negative GI ROS, Neg liver ROS,   Endo/Other  negative endocrine ROS  Renal/GU negative Renal ROS     Musculoskeletal   Abdominal   Peds  Hematology negative hematology ROS (+)   Anesthesia Other Findings   Reproductive/Obstetrics (+) Pregnancy                             Anesthesia Physical  Anesthesia Plan  ASA: II  Anesthesia Plan: Epidural   Post-op Pain Management:    Induction:   PONV Risk Score and Plan:   Airway Management Planned:   Additional Equipment:   Intra-op Plan:   Post-operative Plan:   Informed Consent: I have reviewed the patients History and Physical, chart, labs and discussed the procedure including the risks, benefits and alternatives for the proposed anesthesia with the patient or authorized representative who has indicated his/her understanding and acceptance.     Plan Discussed with:   Anesthesia Plan Comments:         Anesthesia Quick Evaluation

## 2017-01-27 NOTE — H&P (Signed)
OBSTETRIC ADMISSION HISTORY AND PHYSICAL  Patricia Jefferson is a 31 y.o. female [phone] with IUP at 6241w3d by U/S presenting for PROM.   She reports this morning around 0900, she had three large gushes of fluid.  She then began having contractions approximately every 6-8 minutes apart.  She reports adequate fetal movement.  No vaginal bleeding.  She reports seeing some "dark spots" in her vision yesterday but this has resolved.  No headache, worsening edema or epigastric/RUQ pain.  This pregnancy has been complicated by size less than dates via U/S and tobacco abuse.  She is undecided on feeding. She is contemplating BTL for birth control and has signed consent.   [facility]   Dating: By 16wk U/S --->  Estimated Date of Delivery: 01/24/17  Sono:   @[redacted]w[redacted]d , CWD, normal anatomy, cephalic presentation, 3253g, 45%58% EFW   Prenatal History/Complications: [facility]  Initiated Care at  18wks  FOB Rhae HammockGregory Tuggle  Dating By 2nd trimester U/S 16wk  Pap 08/24/16: neg w/ -HRHPV  GC/CT Initial: -/-               36+wks:-/-  Genetic Screen AFP DSR 1:32>informaseq normal  CF screen declined  Anatomic US Female, CP cyst> resolved     Flu vaccine Declined flu shot 08/24/16  Tdap Recommended ~ 28wks  Glucose Screen  2 hr normal: 84/130/103  GBS neg  Feed Preference undecided  Contraception In-hospital BTL, consent: 11/03/16   Circumcision n/a  Childbirth Classes declined  Pediatrician Undecided, info given    Past Medical History: Past Medical History:  Diagnosis Date  . Anemia   . Anxiety   . Back pain   . Blood transfusion without reported diagnosis   . Hypokalemia   . Liver laceration 02/18/2016  . Splenic laceration 02/16/2016  . Traumatic pneumothorax 02/18/2016    Past Surgical History: Past Surgical History:  Procedure Laterality Date  . ADENOIDECTOMY    . CHEST TUBE INSERTION Left 02/16/2016   Procedure: CHEST TUBE INSERTION;  Surgeon: Violeta GelinasBurke  Thompson, MD;  [facility];  Service: General;  Laterality: Left;  . CHOLECYSTECTOMY N/A 02/16/2016   Procedure: CHOLECYSTECTOMY;  Surgeon: Violeta GelinasBurke Thompson, MD;  [facility];  Service: General;  Laterality: N/A;  . EAR TUBE REMOVAL    . LAPAROTOMY N/A 02/16/2016   Procedure: EXPLORATORY LAPAROTOMY;  Surgeon: Violeta GelinasBurke Thompson, MD;  [facility];  Service: General;  Laterality: N/A;  . 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 TONSILLECTOMY    . WISDOM TOOTH EXTRACTION      Obstetrical History: OB History    Gravida Para Term Preterm AB Living   6 3 3   2 3    SAB TAB Ectopic Multiple Live Births   2       3      Social History: Social History   Social History  . Marital status: Single    Spouse name: N/A  . Number of children: N/A  . Years of education: N/A   Social History Main Topics  . Smoking status: Current Every Day Smoker    Packs/day: 0.50    Years: 17.00    Types: Cigarettes  . Smokeless tobacco: Never Used  . Alcohol use No     Comment: socially; not now  . Drug use: No  . Sexual activity: Yes  Birth control/ protection: None   Other Topics Concern  . None   Social History Narrative  . None    Family History: Family History  Problem Relation Age of Onset  . Cancer Paternal Grandfather        throat  . Other Paternal Grandmother        aneursym  . Other Maternal Grandmother        heart issues  . Diabetes Maternal Grandmother   . Cirrhosis Father   . Alcohol abuse Father   . Other Son        mitral valve regurgitation  . Diabetes Maternal Aunt     Allergies: No Known Allergies  Prescriptions Prior to Admission  Medication Sig Dispense Refill Last Dose  . diphenhydramine-acetaminophen (TYLENOL PM) 25-500 MG TABS tablet Take 1 tablet by mouth at bedtime as needed. For sleep   01/26/2017 at Unknown time  . flintstones complete  (FLINTSTONES) 60 MG chewable tablet Chew 1 tablet by mouth daily.   Past Week at Unknown time  . HYDROcodone-acetaminophen (NORCO/VICODIN) 5-325 MG tablet Take 1 tablet by mouth every 6 (six) hours as needed for moderate pain.   Past Week at Unknown time     Review of Systems   All systems reviewed and negative except as stated in HPI  Blood pressure 129/80, pulse 77, temperature 98.5 F (36.9 C), resp. rate 16, height  (1.676 m), weight 80.3 kg (177 lb). General appearance: alert and cooperative Lungs: clear to auscultation bilaterally Heart: regular rate and rhythm Abdomen: soft, non-tender; bowel sounds normal Pelvic: SVE 2/20/-2 Extremities: Homans sign is negative, no sign of DVT DTR's 2+ bilaterally Presentation: cephalic Fetal monitoringBaseline: 130 bpm, moderate variability, +accels, no decels Uterine activity q6-8 min Dilation: 2 Effacement (%): 20 Station: -2 Exam by:: Allena Napoleon, CMD (resident)   Prenatal labs: ABO, Rh: --/--/O POS (09/06 1525) Antibody: NEG (09/06 1525) Rubella: 1.68 (04/03 1156) RPR: Non Reactive (06/21 0909)  HBsAg: Negative (04/03 1156)  HIV:   Nonreactive GBS:   Negative 2 hr Glucola WNL Genetic screening  AFP DSR 1:32>informaseq normal Anatomy US WNL  Prenatal Transfer Tool  Maternal Diabetes: No Genetic Screening: Normal Maternal Ultrasounds/Referrals: Normal Fetal Ultrasounds or other Referrals:  None Maternal Substance Abuse:  Yes:  Type: Smoker Significant Maternal Medications: Occasional Vicodin use Significant Maternal Lab Results: None  Results for orders placed or performed during the hospital encounter of 01/27/17 (from the past 24 hour(s))  CBC   Collection Time: 01/27/17  3:25 PM  Result Value Ref Range   WBC 16.7 (H) 4.0 - 10.5 K/uL   RBC 3.53 (L) 3.87 - 5.11 MIL/uL   Hemoglobin 10.8 (L) 12.0 - 15.0 g/dL   HCT 16.1 (L) 09.6 - 04.5 %   MCV 90.9 78.0 - 100.0 fL   MCH 30.6 26.0 - 34.0 pg   MCHC 33.6 30.0 - 36.0  g/dL   RDW 40.9 81.1 - 91.4 %   Platelets 321 150 - 400 K/uL  Type and screen [facility]   Collection Time: 01/27/17  3:25 PM  Result Value Ref Range   ABO/RH(D) O POS    Antibody Screen NEG    Sample Expiration 01/30/2017     Patient Active Problem List   Diagnosis Date Noted  . PROM (premature rupture of membranes) 01/27/2017  . Supervision of normal pregnancy 08/24/2016  . Smoker 08/24/2016  . 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 History of cholecystectomy 08/24/2016    Assessment/Plan:  Patricia Jefferson is a 31 y.o. [phone] at [redacted]w[redacted]d here for PROM and latent labor.   #Labor: Cytotec and FB placed, continue to monitor for progression; will start pitocin later if needed to further augment #Pain: Epidural if desired #Visual scomata: Brief, none presently; will monitor BP closely and obtain urine protein:creatinine ratio #History of chronic pain:  Reports occasional Vicodin use, will obtain UDS  #FWB: Category 1 #ID:  GBS negative #MOF:  Undecided #MOC:  Consented for BTL  #Circ:  N/A   Larene Beach, DO PGY-2 01/27/2017, 4:59 PM  OB FELLOW HISTORY AND PHYSICAL ATTESTATION  I have seen and examined this patient; I agree with above documentation in the resident's note.  - PROM at 0900 today, not in active labor. Start cervical ripening with misoprostol and FB - Contraception: BTL papers signed 6/13, but patient unsure if she wants this.   Frederik Pear, MD OB Fellow 01/27/2017, 5:24 PM

## 2017-01-28 ENCOUNTER — Encounter: Payer: Self-pay | Admitting: Women's Health

## 2017-01-28 ENCOUNTER — Other Ambulatory Visit: Payer: Self-pay

## 2017-01-28 ENCOUNTER — Encounter (HOSPITAL_COMMUNITY): Payer: Self-pay

## 2017-01-28 LAB — RPR: RPR: NONREACTIVE

## 2017-01-28 MED ORDER — SIMETHICONE 80 MG PO CHEW: 80.0000 mg | CHEWABLE_TABLET | ORAL | Status: DC | PRN

## 2017-01-28 MED ORDER — FLEET ENEMA 7-19 GM/118ML RE ENEM: 1.0000 | ENEMA | Freq: Every day | RECTAL | Status: DC | PRN

## 2017-01-28 MED ORDER — ACETAMINOPHEN 325 MG PO TABS
650.0000 mg | ORAL_TABLET | ORAL | Status: DC | PRN
  Administered 2017-01-28: 650 mg via ORAL

## 2017-01-28 MED ORDER — DIBUCAINE 1 % RE OINT: 1.0000 "application " | TOPICAL_OINTMENT | RECTAL | Status: DC | PRN

## 2017-01-28 MED ORDER — DOCUSATE SODIUM 100 MG PO CAPS
100.0000 mg | ORAL_CAPSULE | Freq: Two times a day (BID) | ORAL | Status: DC
  Administered 2017-01-28 – 2017-01-29 (×3): 100 mg via ORAL
  Filled 2017-01-28 (×3): qty 1

## 2017-01-28 MED ORDER — BENZOCAINE-MENTHOL 20-0.5 % EX AERO: 1.0000 "application " | INHALATION_SPRAY | CUTANEOUS | Status: DC | PRN

## 2017-01-28 MED ORDER — DIPHENHYDRAMINE HCL 25 MG PO CAPS: 25.0000 mg | ORAL_CAPSULE | Freq: Four times a day (QID) | ORAL | Status: DC | PRN

## 2017-01-28 MED ORDER — TETANUS-DIPHTH-ACELL PERTUSSIS 5-2.5-18.5 LF-MCG/0.5 IM SUSP: 0.5000 mL | Freq: Once | INTRAMUSCULAR | Status: DC

## 2017-01-28 MED ORDER — PRENATAL MULTIVITAMIN CH
1.0000 | ORAL_TABLET | Freq: Every day | ORAL | Status: DC
  Administered 2017-01-28 – 2017-01-29 (×2): 1 via ORAL
  Filled 2017-01-28 (×2): qty 1

## 2017-01-28 MED ORDER — ONDANSETRON HCL 4 MG PO TABS: 4.0000 mg | ORAL_TABLET | ORAL | Status: DC | PRN

## 2017-01-28 MED ORDER — ZOLPIDEM TARTRATE 5 MG PO TABS: 5.0000 mg | ORAL_TABLET | Freq: Every evening | ORAL | Status: DC | PRN

## 2017-01-28 MED ORDER — BISACODYL 10 MG RE SUPP: 10.0000 mg | Freq: Every day | RECTAL | Status: DC | PRN

## 2017-01-28 MED ORDER — WITCH HAZEL-GLYCERIN EX PADS: 1.0000 "application " | MEDICATED_PAD | CUTANEOUS | Status: DC | PRN

## 2017-01-28 MED ORDER — ONDANSETRON HCL 4 MG/2ML IJ SOLN: 4.0000 mg | INTRAMUSCULAR | Status: DC | PRN

## 2017-01-28 MED ORDER — COCONUT OIL OIL: 1.0000 "application " | TOPICAL_OIL | Status: DC | PRN

## 2017-01-28 MED ORDER — FERROUS SULFATE 325 (65 FE) MG PO TABS
325.0000 mg | ORAL_TABLET | Freq: Two times a day (BID) | ORAL | Status: DC
  Administered 2017-01-28 – 2017-01-29 (×3): 325 mg via ORAL
  Filled 2017-01-28 (×3): qty 1

## 2017-01-28 MED ORDER — MEASLES, MUMPS & RUBELLA VAC ~~LOC~~ INJ
0.5000 mL | INJECTION | Freq: Once | SUBCUTANEOUS | Status: DC
  Filled 2017-01-28: qty 0.5

## 2017-01-28 MED ORDER — METHYLERGONOVINE MALEATE 0.2 MG/ML IJ SOLN: 0.2000 mg | INTRAMUSCULAR | Status: DC | PRN

## 2017-01-28 MED ORDER — METHYLERGONOVINE MALEATE 0.2 MG PO TABS: 0.2000 mg | ORAL_TABLET | ORAL | Status: DC | PRN

## 2017-01-28 NOTE — Progress Notes (Signed)
Post Partum Day 1 Subjective: no complaints, up ad lib, voiding and tolerating PO, small lochia, plans to bottle feed, Decided against BTL and plans nexplanon  Objective: Blood pressure 127/63, pulse (!) 58, temperature 97.9 F (36.6 C), temperature source Oral, resp. rate 18, height 5\' 6"  (1.676 m), weight 80.3 kg (177 lb), SpO2 100 %, unknown if currently breastfeeding.  Physical Exam:  General: alert, cooperative and no distress Lochia:normal flow Chest: CTAB Heart: RRR no m/r/g Abdomen: +BS, soft, nontender,  Uterine Fundus: firm DVT Evaluation: No evidence of DVT seen on physical exam. Extremities: trace edema   Recent Labs  01/27/17 1525  HGB 10.8*  HCT 32.1*    Assessment/Plan: Plan for discharge tomorrow   LOS: 1 day   CRESENZO-DISHMAN,Kenzli Barritt 01/28/2017, 7:47 AM

## 2017-01-28 NOTE — Anesthesia Postprocedure Evaluation (Signed)
Anesthesia Post Note  Patient: Patricia MansonJessica A Jefferson  Procedure(s) Performed: * No procedures listed *     Patient location during evaluation: Mother Baby Anesthesia Type: Epidural Level of consciousness: awake and alert, oriented and patient cooperative Pain management: pain level controlled Vital Signs Assessment: post-procedure vital signs reviewed and stable Respiratory status: spontaneous breathing Cardiovascular status: stable Postop Assessment: no headache, epidural receding, patient able to bend at knees and no signs of nausea or vomiting Anesthetic complications: no Comments: Pain Score 2.    Last Vitals:  Vitals:   01/28/17 0215 01/28/17 0616  BP: (!) 132/57 127/63  Pulse: 62 (!) 58  Resp: 18 18  Temp: 36.7 C 36.6 C  SpO2:      Last Pain:  Vitals:   01/28/17 0616  TempSrc: Oral  PainSc: 3    Pain Goal:                 [facility]INKLE,Terrilynn Postell

## 2017-01-28 NOTE — Lactation Note (Signed)
This note was copied from a baby's chart. Lactation Consultation Note  Patient Name: Patricia Gala LewandowskyJessica Jefferson ZOXWR'UToday's Date: 01/28/2017 Reason for consult: Follow-up assessment  Baby is 14 hours old,  1st visit today mom was sleeping / and baby.  2nd LC visit/ baby awake and recently had a bath, LC assisted and reviewed hand expressing,  Several drops and mom started to latch in the cradle position , shall;wo latch noted.  LC offered to assist and showed mom how to latch in the cross cradle, with depth, multiple swallows  Noted, and increased with breast compressions.  LC encouraged mom to feed STS until the baby can stay awake for a feeding.  Reviewed basics of latching / has been 13 years since mom breast fed. Per  Mom only breast fed 1 st baby , 2nd  And 3rd did not breast feed. Per mom did not experience engorgement or issues with breast feeding.  Sore nipple and engorgement prevention and tx reviewed .  LC instructed mom on the use hand pump , and noted the #24 Flange to be a good fit for today, and provided #27 for  When the milk comes in.  S/S 's of mastitis reviewed.  [facility] , Mother informed of post-discharge support and given phone number to the lactation department, including services for phone call assistance; out-patient appointments; and breastfeeding support group. List of other breastfeeding resources in the community given in the handout. Encouraged mother to call for problems or concerns related to breastfeeding.   Maternal Data Has patient been taught Hand Expression?: Yes (LC reviewed and mom able to easily , several drops ) Does the patient have breastfeeding experience prior to this delivery?: Yes  Feeding - baby still feeding at 15 mins  LATCH Score Latch: Grasps breast easily, tongue down, lips flanged, rhythmical sucking.  Audible Swallowing: Spontaneous and intermittent  Type of Nipple: Everted at rest and after  stimulation  Comfort (Breast/Nipple): Soft / non-tender  Hold (Positioning): Assistance needed to correctly position infant at breast and maintain latch.  LATCH Score: 9  Interventions Interventions: Breast feeding basics reviewed;Assisted with latch;Skin to skin;Breast massage;Hand express;Breast compression;Adjust position;Support pillows;Position options;Expressed milk;Hand pump  Lactation Tools Discussed/Used Tools: Pump Breast pump type: Manual WIC Program: Yes Pump Review: Setup, frequency, and cleaning;Milk Storage Initiated by:: MAI  Date initiated:: 01/28/17   Consult Status Consult Status: Follow-up Date: 01/28/17 Follow-up type: In-patient    Matilde SprangMargaret Ann Mauricia Mertens 01/28/2017, 2:06 PM

## 2017-01-28 NOTE — Lactation Note (Signed)
This note was copied from a baby's chart. Lactation Consultation Note  Patient Name: Girl Gala LewandowskyJessica Jacuinde [id number]'YToday's Date: 01/28/2017 Reason for consult: Initial assessment (mom sound asleep in bed, and baby sleeping in crib , will check back later ) Baby is 10 hours old, baby has breast fed x 3 since birth , latch score 7-9 . Voided x1 , and HNS     Maternal Data    Feeding    LATCH Score                   Interventions    Lactation Tools Discussed/Used     Consult Status Consult Status: Follow-up Date: 01/28/17 Follow-up type: In-patient    Matilde SprangMargaret Ann Thao Bauza 01/28/2017, 11:22 AM

## 2017-01-29 ENCOUNTER — Encounter (HOSPITAL_COMMUNITY): Payer: Self-pay | Admitting: Advanced Practice Midwife

## 2017-01-29 MED ORDER — IBUPROFEN 600 MG PO TABS: 600.0000 mg | ORAL_TABLET | Freq: Four times a day (QID) | ORAL | 0 refills | Status: DC | PRN

## 2017-01-29 NOTE — Discharge Summary (Signed)
OB Discharge Summary     Patient Name: Patricia Jefferson DOB: [birth year] MRN: [id number]  Date of admission: 01/27/2017 Delivering MD: Jacklyn ShellRESENZO-DISHMON, FRANCES   Date of discharge: 01/29/2017  Admitting diagnosis: 40.3wks Poss water broke leaking and abd pain Intrauterine pregnancy: 7675w3d     Secondary diagnosis:  Active Problems:   PROM (premature rupture of membranes)  Additional problems: none     Discharge diagnosis: Term Pregnancy Delivered                                                                                                Post partum procedures:none  Augmentation: Cytotec and Foley Balloon  Complications: None  Hospital course:  Induction of Labor With Vaginal Delivery   31 y.o. yo [id number] at 8175w3d was admitted to the hospital 01/27/2017 for induction of labor.  Indication for induction: PROM.  Patient had an uncomplicated labor course as follows: Membrane Rupture Time/Date: 9:00 AM ,01/27/2017   Intrapartum Procedures: Episiotomy: None [1]                                         Lacerations:  None [1]  Patient had delivery of a Viable infant.  Information for the patient's newborn:  Patricia Jefferson, Girl Patricia [[id number]][[id number]]  Delivery Method: Vag-Spont   01/27/2017  Details of delivery can be found in separate delivery note.  Patient had a routine postpartum course. Patient is discharged home 01/29/17.  Physical exam  Vitals:   01/28/17 0215 01/28/17 0616 01/28/17 2000 01/29/17 0654  BP: (!) 132/57 127/63 139/85 123/79  Pulse: 62 (!) 58 68 (!) 55  Resp: 18 18 18 18   Temp: 98.1 F (36.7 C) 97.9 F (36.6 C)  98.2 F (36.8 C)  TempSrc: Oral Oral  Oral  SpO2:      Weight:      Height:       General: alert and cooperative Lochia: appropriate Uterine Fundus: firm Incision: N/A DVT Evaluation: No evidence of DVT seen on physical exam. Labs: Lab Results  Component Value Date   WBC 16.7 (H) 01/27/2017   HGB 10.8 (L) 01/27/2017   HCT 32.1 (L) 01/27/2017   MCV  90.9 01/27/2017   PLT 321 01/27/2017   CMP Latest Ref Rng & Units 01/27/2017  Glucose 65 - 99 mg/dL 98  BUN 6 - 20 mg/dL 8  Creatinine 1.300.44 - 8.651.00 mg/dL 7.840.47  Sodium 696135 - 295145 mmol/L 135  Potassium 3.5 - 5.1 mmol/L 3.7  Chloride 101 - 111 mmol/L 104  CO2 22 - 32 mmol/L 21(L)  Calcium 8.9 - 10.3 mg/dL 2.8(U8.4(L)  Total Protein 6.5 - 8.1 g/dL 6.2(L)  Total Bilirubin 0.3 - 1.2 mg/dL 0.4  Alkaline Phos 38 - 126 U/L 179(H)  AST 15 - 41 U/L 20  ALT 14 - 54 U/L 11(L)    Discharge instruction: per After Visit Summary and "Baby and Me Booklet".  After visit meds:  Allergies as of 01/29/2017   No Known Allergies  Medication List    STOP taking these medications   diphenhydramine-acetaminophen 25-500 MG Tabs tablet Commonly known as:  TYLENOL PM   HYDROcodone-acetaminophen 5-325 MG tablet Commonly known as:  NORCO/VICODIN     TAKE these medications   flintstones complete 60 MG chewable tablet Chew 1 tablet by mouth daily.   ibuprofen 600 MG tablet Commonly known as:  ADVIL,MOTRIN Take 1 tablet (600 mg total) by mouth every 6 (six) hours as needed.            Discharge Care Instructions        Start     Ordered   01/29/17 0000  ibuprofen (ADVIL,MOTRIN) 600 MG tablet  Every 6 hours PRN    Question:  Supervising Provider  Answer:  Hermina Staggers   01/29/17 0745   01/29/17 0000  Discharge patient    Question Answer Comment  Discharge disposition 01-Home or Self Care   Discharge patient date 01/29/2017      01/29/17 0745      Diet: routine diet  Activity: Advance as tolerated. Pelvic rest for 6 weeks.   Outpatient follow up:4 weeks Follow up Appt:No future appointments. Follow up Visit:No Follow-up on file.  Postpartum contraception: Nexplanon  Newborn Data: Live born female  Birth Weight: 7 lb 7.4 oz (3385 g) APGAR: 8, 9  Baby Feeding: Bottle and Breast Disposition:home with mother   01/29/2017 Cam Hai, CNM 8:36 AM

## 2017-01-29 NOTE — Discharge Instructions (Signed)

## 2017-01-29 NOTE — Lactation Note (Addendum)
This note was copied from a baby's chart. Lactation Consultation Note  Patient Name: Girl Gala LewandowskyJessica Riggsbee [id number]'QToday's Date: 01/29/2017 Reason for consult: Follow-up assessment  Baby 35 hours old. Mom reports that she is putting baby to breast with cues, then supplementing as needed. Mom reports that baby is latching much better now. Mom states that she nursed her first child some, and wants to nurse and bottle-feed this baby as well. Mom aware of OP/BFSG and LC phone line assistance after D/C.   Maternal Data    Feeding Feeding Type: Breast Fed Length of feed: 15 min  LATCH Score                   Interventions    Lactation Tools Discussed/Used     Consult Status Consult Status: PRN    Sherlyn HayJennifer D Miran Kautzman 01/29/2017, 10:27 AM

## 2017-01-30 ENCOUNTER — Ambulatory Visit: Payer: Self-pay

## 2017-01-30 NOTE — Lactation Note (Signed)
This note was copied from a baby's chart. Lactation Consultation Note  Patient Name: Girl Gala LewandowskyJessica Fentress ZOXWR'UToday's Date: 01/30/2017 Reason for consult: Follow-up assessment Baby at 60 hr of life and dyad set for d/c today. Mom reports she is offering the breast at every feeding cue but sometimes "she is too impatient". She is offering bottles of formula at "most feedings". She has DEBP at home and was given a Harmony on 01/29/17 by staff. She stated she knows how to use both pumps. Discussed baby behavior, feeding frequency, baby belly size, voids, wt loss, breast changes, and nipple care. Parents are aware of lactation services and support group. They will call as needed.     Maternal Data    Feeding    LATCH Score                   Interventions    Lactation Tools Discussed/Used     Consult Status Consult Status: Complete    Rulon Eisenmengerlizabeth E Janett Kamath 01/30/2017, 12:06 PM

## 2017-03-03 ENCOUNTER — Ambulatory Visit (INDEPENDENT_AMBULATORY_CARE_PROVIDER_SITE_OTHER): Payer: Medicaid Other | Admitting: Adult Health

## 2017-03-03 ENCOUNTER — Encounter: Payer: Self-pay | Admitting: Adult Health

## 2017-03-03 DIAGNOSIS — Z3009 Encounter for other general counseling and advice on contraception: Secondary | ICD-10-CM

## 2017-03-03 DIAGNOSIS — O99345 Other mental disorders complicating the puerperium: Secondary | ICD-10-CM | POA: Insufficient documentation

## 2017-03-03 DIAGNOSIS — Z1389 Encounter for screening for other disorder: Secondary | ICD-10-CM | POA: Diagnosis not present

## 2017-03-03 DIAGNOSIS — Z1331 Encounter for screening for depression: Secondary | ICD-10-CM | POA: Diagnosis not present

## 2017-03-03 DIAGNOSIS — F53 Postpartum depression: Secondary | ICD-10-CM

## 2017-03-03 DIAGNOSIS — Z3202 Encounter for pregnancy test, result negative: Secondary | ICD-10-CM

## 2017-03-03 DIAGNOSIS — R030 Elevated blood-pressure reading, without diagnosis of hypertension: Secondary | ICD-10-CM

## 2017-03-03 LAB — POCT URINE PREGNANCY: PREG TEST UR: NEGATIVE

## 2017-03-03 MED ORDER — SERTRALINE HCL 50 MG PO TABS: 50.0000 mg | ORAL_TABLET | Freq: Every day | ORAL | 6 refills | Status: DC

## 2017-03-03 NOTE — Addendum Note (Signed)
Addended by: Federico Flake A on: 03/03/2017 12:43 PM   Modules accepted: Orders

## 2017-03-03 NOTE — Patient Instructions (Signed)
Postpartum Depression and Baby Blues The postpartum period begins right after the birth of a baby. During this time, there is often a great amount of joy and excitement. It is also a time of many changes in the life of the parents. Regardless of how many times a mother gives birth, each child brings new challenges and dynamics to the family. It is not unusual to have feelings of excitement along with confusing shifts in moods, emotions, and thoughts. All mothers are at risk of developing postpartum depression or the "baby blues." These mood changes can occur right after giving birth, or they may occur many months after giving birth. The baby blues or postpartum depression can be mild or severe. Additionally, postpartum depression can go away rather quickly, or it can be a long-term condition. What are the causes? Raised hormone levels and the rapid drop in those levels are thought to be a main cause of postpartum depression and the baby blues. A number of hormones change during and after pregnancy. Estrogen and progesterone usually decrease right after the delivery of your baby. The levels of thyroid hormone and various cortisol steroids also rapidly drop. Other factors that play a role in these mood changes include major life events and genetics. What increases the risk? If you have any of the following risks for the baby blues or postpartum depression, know what symptoms to watch out for during the postpartum period. Risk factors that may increase the likelihood of getting the baby blues or postpartum depression include:  Having a personal or family history of depression.  Having depression while being pregnant.  Having premenstrual mood issues or mood issues related to oral contraceptives.  Having a lot of life stress.  Having marital conflict.  Lacking a social support network.  Having a baby with special needs.  Having health problems, such as diabetes.  What are the signs or  symptoms? Symptoms of baby blues include:  Brief changes in mood, such as going from extreme happiness to sadness.  Decreased concentration.  Difficulty sleeping.  Crying spells, tearfulness.  Irritability.  Anxiety.  Symptoms of postpartum depression typically begin within the first month after giving birth. These symptoms include:  Difficulty sleeping or excessive sleepiness.  Marked weight loss.  Agitation.  Feelings of worthlessness.  Lack of interest in activity or food.  Postpartum psychosis is a very serious condition and can be dangerous. Fortunately, it is rare. Displaying any of the following symptoms is cause for immediate medical attention. Symptoms of postpartum psychosis include:  Hallucinations and delusions.  Bizarre or disorganized behavior.  Confusion or disorientation.  How is this diagnosed? A diagnosis is made by an evaluation of your symptoms. There are no medical or lab tests that lead to a diagnosis, but there are various questionnaires that a health care provider may use to identify those with the baby blues, postpartum depression, or psychosis. Often, a screening tool called the Edinburgh Postnatal Depression Scale is used to diagnose depression in the postpartum period. How is this treated? The baby blues usually goes away on its own in 1-2 weeks. Social support is often all that is needed. You will be encouraged to get adequate sleep and rest. Occasionally, you may be given medicines to help you sleep. Postpartum depression requires treatment because it can last several months or longer if it is not treated. Treatment may include individual or group therapy, medicine, or both to address any social, physiological, and psychological factors that may play a role in the   depression. Regular exercise, a healthy diet, rest, and social support may also be strongly recommended. Postpartum psychosis is more serious and needs treatment right away.  Hospitalization is often needed. Follow these instructions at home:  Get as much rest as you can. Nap when the baby sleeps.  Exercise regularly. Some women find yoga and walking to be beneficial.  Eat a balanced and nourishing diet.  Do little things that you enjoy. Have a cup of tea, take a bubble bath, read your favorite magazine, or listen to your favorite music.  Avoid alcohol.  Ask for help with household chores, cooking, grocery shopping, or running errands as needed. Do not try to do everything.  Talk to people close to you about how you are feeling. Get support from your partner, family members, friends, or other new moms.  Try to stay positive in how you think. Think about the things you are grateful for.  Do not spend a lot of time alone.  Only take over-the-counter or prescription medicine as directed by your health care provider.  Keep all your postpartum appointments.  Let your health care provider know if you have any concerns. Contact a health care provider if: You are having a reaction to or problems with your medicine. Get help right away if:  You have suicidal feelings.  You think you may harm the baby or someone else. This information is not intended to replace advice given to you by your health care provider. Make sure you discuss any questions you have with your health care provider. Document Released: 02/12/2004 Document Revised: 10/16/2015 Document Reviewed: 02/19/2013 Elsevier Interactive Patient Education  2017 [employer]. No sex

## 2017-03-03 NOTE — Progress Notes (Signed)
Patient ID: Patricia Jefferson, female   DOB: 01/05/1986, 31 y.o.   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  Delivery Date: 01/27/17  Method of Delivery: Vaginal delivery baby girl 7+ lbs, Lattie Haw  Sexual Activity since delivery: Yes  X 1 with out condom  Method of Feeding: Bottle  Number of weeks bleeding post delivery: 5 weeks   Review of Systems: Patient denies any headaches, hearing loss, fatigue, blurred vision, shortness of breath, chest pain, abdominal pain, problems with bowel movements, urination, or intercourse. No joint pain or mood swings. Reviewed past medical,surgical, social and family history. Reviewed medications and allergies.  Depression Score: 16, denies being suicidal, and work like to try meds, has used lexapro in past and it made her feel crazy she says  BP (!) 130/100 (BP Location: Left Arm, Patient Position: Sitting, Cuff Size: Small)   Pulse 95   Ht  (1.676 m)   Wt 155 lb (70.3 kg)   Breastfeeding? No   BMI 25.02 kg/m UPT negative.BP recheck 130/90 Skin warm and dry. Neck: mid line trachea, normal thyroid, good ROM, no lymphadenopathy noted. Lungs: clear to ausculation bilaterally. Cardiovascular: regular rate and rhythm.Abdomen is soft and non tender,no HSM noted.  Pelvic Exam:   External genitalia is normal in appearance, no lesions. Has clit rod. The vagina has good color, moisture and rugae, no lesions.Urethra has no masses or tenderness noted. The cervix is bulbous.  Uterus is felt to be normal size, shape, and contour, well involuted.  No adnexal masses or tenderness noted.Bladder is non tender, no masses felt. 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   Impression:  1. Postpartum care and examination   2. Positive depression screening   3. Depression, postpartum   4. Encounter for  counseling regarding initiation of other contraceptive measure   5. Elevated BP without diagnosis of hypertension      Plan:   Meds ordered this encounter  Medications  . sertraline (ZOLOFT) 50 MG tablet    Sig: Take 1 tablet (50 mg total) by mouth daily.    Dispense:  30 tablet    Refill:  6    Order Specific Question:   Supervising Provider    Answer:   Despina Hidden, LUTHER H [[id number]]  Nexplanon ordered Recheck BP in 1 week No sex Nexplanon ordered Return in 3 weeks for stat [facility] in am, has order and then in pm for Nexplanon insertion

## 2017-03-07 ENCOUNTER — Telehealth: Payer: Self-pay | Admitting: *Deleted

## 2017-03-07 ENCOUNTER — Encounter: Payer: Self-pay | Admitting: *Deleted

## 2017-03-07 NOTE — Telephone Encounter (Signed)
LMOVM to return call for return to work date.

## 2017-03-10 ENCOUNTER — Ambulatory Visit (INDEPENDENT_AMBULATORY_CARE_PROVIDER_SITE_OTHER): Payer: Medicaid Other | Admitting: Adult Health

## 2017-03-10 ENCOUNTER — Encounter: Payer: Self-pay | Admitting: Adult Health

## 2017-03-10 VITALS — BP 130/70 | HR 72 | Ht 66.0 in | Wt 154.5 lb

## 2017-03-10 DIAGNOSIS — Z0130 Encounter for examination of blood pressure without abnormal findings: Secondary | ICD-10-CM | POA: Diagnosis not present

## 2017-03-10 NOTE — Progress Notes (Signed)
Subjective:     Patient ID: Patricia Jefferson, female   DOB: 1986/04/29, 31 y.o.   MRN: [id number]  HPI Patricia Jefferson is a 31 year old white female in for BP check. No complaints.   Review of Systems  Patient denies any headaches, hearing loss, fatigue, blurred vision, shortness of breath, chest pain, abdominal pain, problems with bowel movements, urination, or intercourse(no sex yet). No joint pain or mood swings.On zoloft and doing OK,back at work and that was good.  Reviewed past medical,surgical, social and family history. Reviewed medications and allergies.     Objective:   Physical Exam BP 130/70 (BP Location: Left Arm, Patient Position: Sitting, Cuff Size: Normal)   Pulse 72   Ht 5\' 6"  (1.676 m)   Wt 154 lb 8 oz (70.1 kg)   Breastfeeding? No   BMI 24.94 kg/m    Talk only, doing good at work,taking zoloft, and BP is normal today.No sex, F/U for nexplanon insertion.  Assessment:     BP check, normal    Plan:     Return as scheduled for nexplanon insertion No sex

## 2017-03-24 ENCOUNTER — Encounter: Payer: Medicaid Other | Admitting: Adult Health

## 2017-03-24 ENCOUNTER — Other Ambulatory Visit: Payer: Medicaid Other
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
## 2022-11-03 ENCOUNTER — Ambulatory Visit: Payer: PRIVATE HEALTH INSURANCE | Admitting: Family Medicine

## 2022-11-03 ENCOUNTER — Encounter: Payer: Self-pay | Admitting: Family Medicine

## 2022-11-03 VITALS — BP 127/83 | HR 89 | Temp 97.6°F | Ht 66.0 in | Wt 214.0 lb

## 2022-11-03 DIAGNOSIS — Z9081 Acquired absence of spleen: Secondary | ICD-10-CM

## 2022-11-03 DIAGNOSIS — Z862 Personal history of diseases of the blood and blood-forming organs and certain disorders involving the immune mechanism: Secondary | ICD-10-CM

## 2022-11-03 DIAGNOSIS — Z23 Encounter for immunization: Secondary | ICD-10-CM

## 2022-11-03 DIAGNOSIS — L659 Nonscarring hair loss, unspecified: Secondary | ICD-10-CM | POA: Diagnosis not present

## 2022-11-03 DIAGNOSIS — Z13220 Encounter for screening for lipoid disorders: Secondary | ICD-10-CM

## 2022-11-03 DIAGNOSIS — Z130 Encounter for screening for diseases of the blood and blood-forming organs and certain disorders involving the immune mechanism: Secondary | ICD-10-CM
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
## 2022-11-03 NOTE — Patient Instructions (Signed)
Meningococcal B (4 strain) Vaccine Injection What is this medication? MENINGOCOCCAL B VACCINE (muh nin jeh KOK kul B vak SEEN) reduces the risk of meningitis. It does not treat meningitis. It is still possible to get meningitis after receiving this vaccine, but the symptoms may be less severe or not last as long. It works by helping your immune system learn how to fight off a future infection. This medicine may be used for other purposes; ask your health care provider or pharmacist if you have questions. COMMON BRAND NAME(S): TRUMENBA What should I tell my care team before I take this medication? They need to know if you have any of these conditions: Bleeding disorder Fever or infection Immune system problems An unusual or allergic reaction to meningococcal vaccine, other vaccines, other medications, foods, dyes, or preservatives Pregnant or trying to get pregnant Breastfeeding How should I use this medication? This vaccine is injected into a muscle. It is given by your care team. A copy of Vaccine Information Statements will be given before each vaccination. Be sure to read this information carefully each time. This sheet may change often. Talk to your care team about the use of this medication in children. While it may be prescribed for children as young as 10 years for selected conditions, precautions do apply. Overdosage: If you think you have taken too much of this medicine contact a poison control center or emergency room at once. NOTE: This medicine is only for you. Do not share this medicine with others. What if I miss a dose? Keep appointments for follow-up doses as directed. It is important not to miss your dose. Call your care team if you are unable to keep an appointment. What may interact with this medication? Certain medications that treat or prevent blood clots Medications that lower your chance of fighting an infection Other vaccines This list may not describe all possible  interactions. Give your health care provider a list of all the medicines, herbs, non-prescription drugs, or dietary supplements you use. Also tell them if you smoke, drink alcohol, or use illegal drugs. Some items may interact with your medicine. What should I watch for while using this medication? Report any side effects to your care team right away. This vaccine may not protect from all meningitis infections. What side effects may I notice from receiving this medication? Side effects that you should report to your care team as soon as possible: Allergic reactions--skin rash, itching, hives, swelling of the face, lips, tongue, or throat Feeling faint or lightheaded Side effects that usually do not require medical attention (report these to your care team if they continue or are bothersome): Fatigue Headache Joint pain Muscle pain Nausea Pain, redness, or irritation at injection site This list may not describe all possible side effects. Call your doctor for medical advice about side effects. You may report side effects to FDA at 1-800-FDA-1088. Where should I keep my medication? This vaccine is only given by your care team. It will not be stored at home. NOTE: This sheet is a summary. It may not cover all possible information. If you have questions about this medicine, talk to your doctor, pharmacist, or health care provider.  2024 Elsevier/Gold Standard (2021-10-20 00:00:00)

## 2022-11-04 LAB — ANEMIA PROFILE B
Basos: 1 %
Ferritin: 56 ng/mL (ref 15–150)
Immature Grans (Abs): 0.2 10*3/uL — ABNORMAL HIGH (ref 0.0–0.1)
Monocytes Absolute: 1.4 10*3/uL — ABNORMAL HIGH (ref 0.1–0.9)
Retic Ct Pct: 2.5 % (ref 0.6–2.6)
UIBC: 306 ug/dL (ref 131–425)

## 2022-11-04 LAB — HORMONE PANEL (T4,TSH,FSH,TESTT,SHBG,DHEA,ETC)

## 2022-11-04 LAB — CMP14+EGFR
BUN: 9 mg/dL (ref 6–20)
Potassium: 4 mmol/L (ref 3.5–5.2)

## 2022-11-08 LAB — ANEMIA PROFILE B
EOS (ABSOLUTE): 0.4 10*3/uL (ref 0.0–0.4)
Folate: 7.4 ng/mL (ref >3.0)
Hematocrit: 45.5 % (ref 34.0–46.6)
Monocytes: 9 %
RBC: 4.64 x10E6/uL (ref 3.77–5.28)
WBC: 15.3 10*3/uL — ABNORMAL HIGH (ref 3.4–10.8)

## 2022-11-08 LAB — CMP14+EGFR
Bilirubin Total: 0.3 mg/dL (ref 0.0–1.2)
Creatinine, Ser: 0.73 mg/dL (ref 0.57–1.00)

## 2022-11-10 LAB — CMP14+EGFR
Alkaline Phosphatase: 242 IU/L — ABNORMAL HIGH (ref 44–121)
CO2: 23 mmol/L (ref 20–29)
Chloride: 103 mmol/L (ref 96–106)

## 2022-11-10 LAB — HORMONE PANEL (T4,TSH,FSH,TESTT,SHBG,DHEA,ETC): Sex Hormone Binding Globulin: 60.2 nmol/L

## 2022-11-10 LAB — ANEMIA PROFILE B: Vitamin B-12: 421 pg/mL (ref 232–1245)

## 2022-11-12 ENCOUNTER — Telehealth: Payer: Self-pay | Admitting: Family Medicine

## 2022-11-12 NOTE — Telephone Encounter (Signed)
Pt wants covering provider to review her lab results and call her with results asap because she says she has been waiting over a week to get them.

## 2022-11-13 LAB — ANEMIA PROFILE B
Eos: 3 %
Hemoglobin: 15.7 g/dL (ref 11.1–15.9)
Lymphocytes Absolute: 5 10*3/uL — ABNORMAL HIGH (ref 0.7–3.1)
MCH: 33.8 pg — ABNORMAL HIGH (ref 26.6–33.0)
MCHC: 34.5 g/dL (ref 31.5–35.7)
Neutrophils Absolute: 8.1 10*3/uL — ABNORMAL HIGH (ref 1.4–7.0)
Neutrophils: 52 %
Platelets: 375 10*3/uL (ref 150–450)
Total Iron Binding Capacity: 375 ug/dL (ref 250–450)

## 2022-11-13 LAB — LIPID PANEL
HDL: 55 mg/dL (ref >39)
LDL Chol Calc (NIH): 98 mg/dL (ref 0–99)
Triglycerides: 92 mg/dL (ref 0–149)
VLDL Cholesterol Cal: 17 mg/dL (ref 5–40)

## 2022-11-13 LAB — CMP14+EGFR: Globulin, Total: 2.7 g/dL (ref 1.5–4.5)

## 2022-11-14 LAB — CMP14+EGFR
Albumin/Globulin Ratio: 1.6
BUN/Creatinine Ratio: 12 (ref 9–23)
Sodium: 140 mmol/L (ref 134–144)
Total Protein: 7.1 g/dL (ref 6.0–8.5)

## 2022-11-14 LAB — HORMONE PANEL (T4,TSH,FSH,TESTT,SHBG,DHEA,ETC): Progesterone, Serum: <10 ng/dL

## 2022-11-14 LAB — LIPID PANEL: Cholesterol, Total: 170 mg/dL (ref 100–199)

## 2022-11-14 LAB — ANEMIA PROFILE B: Immature Granulocytes: 2 %

## 2022-11-15 ENCOUNTER — Other Ambulatory Visit: Payer: Self-pay | Admitting: Family Medicine

## 2022-11-15 DIAGNOSIS — D72829 Elevated white blood cell count, unspecified: Secondary | ICD-10-CM

## 2022-11-15 DIAGNOSIS — R748 Abnormal levels of other serum enzymes: Secondary | ICD-10-CM

## 2022-11-15 DIAGNOSIS — E559 Vitamin D deficiency, unspecified: Secondary | ICD-10-CM

## 2022-11-15 LAB — HORMONE PANEL (T4,TSH,FSH,TESTT,SHBG,DHEA,ETC)
Free T-3: 3.4 pg/mL
Testosterone-% Free: 1 %

## 2022-11-15 LAB — ANEMIA PROFILE B
Basophils Absolute: 0.2 10*3/uL (ref 0.0–0.2)
Lymphs: 33 %
MCV: 98 fL — ABNORMAL HIGH (ref 79–97)
RDW: 13 % (ref 11.7–15.4)

## 2022-11-15 LAB — CMP14+EGFR
ALT: 53 IU/L — ABNORMAL HIGH (ref 0–32)
Albumin: 4.4 g/dL (ref 3.9–4.9)
eGFR: 108 mL/min/{1.73_m2} (ref >59)

## 2022-11-15 LAB — VITAMIN D 25 HYDROXY (VIT D DEFICIENCY, FRACTURES): Vit D, 25-Hydroxy: 13 ng/mL — ABNORMAL LOW (ref 30.0–100.0)

## 2022-11-15 LAB — LIPID PANEL: Chol/HDL Ratio: 3.1 ratio (ref 0.0–4.4)

## 2022-11-15 LAB — ANA W/REFLEX IF POSITIVE: Anti Nuclear Antibody (ANA): NEGATIVE

## 2022-11-15 MED ORDER — VITAMIN D (ERGOCALCIFEROL) 1.25 MG (50000 UNIT) PO CAPS: 50000.0000 [IU] | ORAL_CAPSULE | ORAL | 0 refills | Status: AC

## 2022-11-15 NOTE — Telephone Encounter (Signed)
Elmarie Shiley is here today. Will let her review.   Jannifer Rodney, FNP

## 2022-11-17 ENCOUNTER — Other Ambulatory Visit: Payer: PRIVATE HEALTH INSURANCE

## 2022-11-17 DIAGNOSIS — R748 Abnormal levels of other serum enzymes: Secondary | ICD-10-CM

## 2022-11-17 DIAGNOSIS — D72829 Elevated white blood cell count, unspecified: Secondary | ICD-10-CM

## 2022-11-17 LAB — CBC WITH DIFFERENTIAL/PLATELET
Basophils Absolute: 0.2 10*3/uL (ref 0.0–0.2)
Basos: 1 %
Eos: 3 %
Hemoglobin: 16.3 g/dL — ABNORMAL HIGH (ref 11.1–15.9)
Immature Granulocytes: 2 %
Lymphocytes Absolute: 6.2 10*3/uL — ABNORMAL HIGH (ref 0.7–3.1)
Lymphs: 34 %
MCHC: 34 g/dL (ref 31.5–35.7)
MCV: 101 fL — ABNORMAL HIGH (ref 79–97)
Monocytes: 10 %
RBC: 4.76 x10E6/uL (ref 3.77–5.28)
RDW: 12.3 % (ref 11.7–15.4)

## 2022-11-17 LAB — HEPATIC FUNCTION PANEL
Albumin: 4.4 g/dL (ref 3.9–4.9)
Alkaline Phosphatase: 324 IU/L — ABNORMAL HIGH (ref 44–121)
Bilirubin Total: 0.8 mg/dL (ref 0.0–1.2)
Total Protein: 7.1 g/dL (ref 6.0–8.5)

## 2022-11-18 ENCOUNTER — Other Ambulatory Visit: Payer: Self-pay | Admitting: Family Medicine

## 2022-11-18 LAB — HORMONE PANEL (T4,TSH,FSH,TESTT,SHBG,DHEA,ETC)
Follicle Stimulating Hormone: 12 m[IU]/mL
Free Testosterone, Serum: 6.7 pg/mL — ABNORMAL HIGH
T4: 7.9 ug/dL
TSH: 2.3 uU/mL
Testosterone, Serum (Total): 67 ng/dL — ABNORMAL HIGH
Triiodothyronine (T-3), Serum: 118 ng/dL

## 2022-11-18 LAB — CBC WITH DIFFERENTIAL/PLATELET
EOS (ABSOLUTE): 0.5 10*3/uL — ABNORMAL HIGH (ref 0.0–0.4)
Hematocrit: 47.9 % — ABNORMAL HIGH (ref 34.0–46.6)
Immature Grans (Abs): 0.3 10*3/uL — ABNORMAL HIGH (ref 0.0–0.1)
MCH: 34.2 pg — ABNORMAL HIGH (ref 26.6–33.0)
Monocytes Absolute: 1.7 10*3/uL — ABNORMAL HIGH (ref 0.1–0.9)
Neutrophils Absolute: 9.2 10*3/uL — ABNORMAL HIGH (ref 1.4–7.0)
Neutrophils: 50 %
Platelets: 328 10*3/uL (ref 150–450)
WBC: 18.1 10*3/uL — ABNORMAL HIGH (ref 3.4–10.8)

## 2022-11-18 LAB — HEPATIC FUNCTION PANEL
ALT: 44 IU/L — ABNORMAL HIGH (ref 0–32)
AST: 31 IU/L (ref 0–40)

## 2022-11-18 LAB — ANEMIA PROFILE B
Iron Saturation: 18 % (ref 15–55)
Iron: 69 ug/dL (ref 27–159)

## 2022-11-18 LAB — CMP14+EGFR
AST: 30 IU/L (ref 0–40)
Calcium: 9.8 mg/dL (ref 8.7–10.2)
Glucose: 104 mg/dL — ABNORMAL HIGH (ref 70–99)

## 2022-11-18 LAB — T4, FREE: Free T4: 1.23 ng/dL (ref 0.82–1.77)

## 2022-11-23 ENCOUNTER — Other Ambulatory Visit: Payer: PRIVATE HEALTH INSURANCE

## 2022-12-02 ENCOUNTER — Other Ambulatory Visit: Payer: Self-pay | Admitting: Family Medicine

## 2022-12-02 DIAGNOSIS — D72829 Elevated white blood cell count, unspecified: Secondary | ICD-10-CM

## 2022-12-02 DIAGNOSIS — R7989 Other specified abnormal findings of blood chemistry: Secondary | ICD-10-CM

## 2024-03-08 ENCOUNTER — Encounter: Payer: Self-pay | Admitting: Nurse Practitioner

## 2024-03-08 ENCOUNTER — Ambulatory Visit: Payer: Self-pay | Admitting: Nurse Practitioner

## 2024-03-08 ENCOUNTER — Ambulatory Visit: Payer: Self-pay

## 2024-03-08 VITALS — BP 130/75 | HR 81 | Temp 97.8°F | Ht 66.0 in | Wt 215.2 lb

## 2024-03-08 DIAGNOSIS — H6123 Impacted cerumen, bilateral: Secondary | ICD-10-CM

## 2024-03-08 DIAGNOSIS — H66003 Acute suppurative otitis media without spontaneous rupture of ear drum, bilateral: Secondary | ICD-10-CM

## 2024-03-08 MED ORDER — DEBROX 6.5 % OT SOLN: 5.0000 [drp] | Freq: Two times a day (BID) | OTIC | 1 refills | Status: AC

## 2024-03-08 MED ORDER — CEFDINIR 300 MG PO CAPS: 300.0000 mg | ORAL_CAPSULE | Freq: Two times a day (BID) | ORAL | 0 refills | Status: AC

## 2024-03-08 NOTE — Progress Notes (Signed)
 Subjective:  Patient ID: Patricia Jefferson, female    DOB: 10-31-1985, 38 y.o.   MRN: [id number]  Patient Care Team: Joesph Annabella HERO, FNP as PCP - General (Family Medicine)   Chief Complaint:  Ear Pain (Right side ear, jaw, and tooth pain since Tuesday ) and Jaw Pain   HPI: Patricia Jefferson is a 38 y.o. female presenting on 03/08/2024 for Ear Pain (Right side ear, jaw, and tooth pain since Tuesday ) and Jaw Pain   Discussed the use of AI scribe software for clinical note transcription with the patient, who gave verbal consent to proceed.  History of Present Illness Patricia Jefferson is a 38 year old female who presents with right-sided toothache, earache, and headache.  She has been experiencing a toothache, earache, and headache localized to the right side since Tuesday. There is no known tooth infection, and she has not visited a dentist in a couple of years.  The ear pain is significant, but there is no fever or cough. She has been using Q-tips in her ears and poured peroxide into her ear last night.  For pain management, she has been alternating Tylenol  and Advil , but reports minimal relief from these medications.  No fever or cough.      Relevant past medical, surgical, family, and social history reviewed and updated as indicated.  Allergies and medications reviewed and updated. Data reviewed: Chart in Epic.   Past Medical History:  Diagnosis Date   Anemia    Anxiety    Back pain    Blood transfusion without reported diagnosis    Hypokalemia    Liver laceration 02/18/2016   Splenic laceration 02/16/2016   Traumatic pneumothorax 02/18/2016    Past Surgical History:  Procedure Laterality Date   ADENOIDECTOMY     CHEST TUBE INSERTION Left 02/16/2016   Procedure: CHEST TUBE INSERTION;  Surgeon: Dann Hummer, MD;  Location: [facility] OR;  Service: General;  Laterality: Left;   CHOLECYSTECTOMY N/A 02/16/2016   Procedure: CHOLECYSTECTOMY;  Surgeon: Dann Hummer, MD;   Location: [facility] OR;  Service: General;  Laterality: N/A;   EAR TUBE REMOVAL     LAPAROTOMY N/A 02/16/2016   Procedure: EXPLORATORY LAPAROTOMY;  Surgeon: Dann Hummer, MD;  Location: [facility] OR;  Service: General;  Laterality: N/A;   SPLENECTOMY, 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   Procedure: SPLENECTOMY;  Surgeon: Dann Hummer, MD;  Location: [facility] OR;  Service: General;  Laterality: N/A;   TONSILLECTOMY     WISDOM TOOTH EXTRACTION      Social History   Socioeconomic History   Marital status: Single    Spouse name: Not on file   Number of children: Not on file   Years of education: Not on file   Highest education level: Not on file  Occupational History   Not on file  Tobacco Use   Smoking status: Every Day    Current packs/day: 0.50    Average packs/day: 0.5 packs/day for 17.0 years (8.5 ttl pk-yrs)    Types: Cigarettes   Smokeless tobacco: Never  Substance and Sexual Activity   Alcohol use: No    Comment: socially; not now   Drug use: No   Sexual activity: Not Currently    Birth control/protection: None  Other Topics Concern   Not on file  Social History Narrative   Not on file   Social Drivers of Health   Financial Resource Strain: Not on file  Food Insecurity: Not on file  Transportation Needs: Not  on file  Physical Activity: Not on file  Stress: Not on file  Social Connections: Not on file  Intimate Partner Violence: Not on file    Outpatient Encounter Medications as of 03/08/2024  Medication Sig   Vitamin D , Ergocalciferol , (DRISDOL ) 1.25 MG (50000 UNIT) CAPS capsule Take 1 capsule (50,000 Units total) by mouth every 7 (seven) days.   No facility-administered encounter medications on file as of 03/08/2024.    No Known Allergies  Pertinent ROS per HPI, otherwise unremarkable      Objective:  BP 130/75   Pulse 81   Temp 97.8 F (36.6 C) (Temporal)   Ht 5' 6 (1.676 m)   Wt 215 lb 3.2 oz (97.6 kg)   SpO2 95%   BMI 34.73 kg/m    Wt Readings from Last 3 Encounters:   03/08/24 215 lb 3.2 oz (97.6 kg)  11/03/22 214 lb (97.1 kg)  03/10/17 154 lb 8 oz (70.1 kg)    Physical Exam Vitals and nursing note reviewed.  Constitutional:      General: She is not in acute distress. HENT:     Head: Normocephalic and atraumatic.     Right Ear: Swelling present. There is impacted cerumen. Tympanic membrane is erythematous and bulging.     Left Ear: There is impacted cerumen.     Mouth/Throat:     Mouth: Mucous membranes are moist.  Eyes:     General: No scleral icterus.    Extraocular Movements: Extraocular movements intact.     Conjunctiva/sclera: Conjunctivae normal.     Pupils: Pupils are equal, round, and reactive to light.  Cardiovascular:     Heart sounds: Normal heart sounds.  Pulmonary:     Effort: Pulmonary effort is normal.     Breath sounds: Normal breath sounds.  Musculoskeletal:        General: Normal range of motion.     Right lower leg: No edema.     Left lower leg: No edema.  Skin:    General: Skin is warm and dry.     Findings: No rash.  Neurological:     Mental Status: She is alert and oriented to person, place, and time.  Psychiatric:        Mood and Affect: Mood normal.        Behavior: Behavior normal.        Thought Content: Thought content normal.        Judgment: Judgment normal.    Physical Exam HEENT: Bilateral ear infection     Results for orders placed or performed in visit on 11/17/22  Hepatic Function Panel   Collection Time: 11/17/22  8:59 AM  Result Value Ref Range   Total Protein 7.1 6.0 - 8.5 g/dL   Albumin 4.4 3.9 - 4.9 g/dL   Bilirubin Total 0.8 0.0 - 1.2 mg/dL   Bilirubin, Direct 9.77 0.00 - 0.40 mg/dL   Alkaline Phosphatase 324 (H) 44 - 121 IU/L   AST 31 0 - 40 IU/L   ALT 44 (H) 0 - 32 IU/L  CBC with Differential/Platelet   Collection Time: 11/17/22  8:59 AM  Result Value Ref Range   WBC 18.1 (H) 3.4 - 10.8 x10E3/uL   RBC 4.76 3.77 - 5.28 x10E6/uL   Hemoglobin 16.3 (H) 11.1 - 15.9 g/dL    Hematocrit 52.0 (H) 34.0 - 46.6 %   MCV 101 (H) 79 - 97 fL   MCH 34.2 (H) 26.6 - 33.0 pg   MCHC 34.0 31.5 -  35.7 g/dL   RDW 87.6 88.2 - 84.5 %   Platelets 328 150 - 450 x10E3/uL   Neutrophils 50 Not Estab. %   Lymphs 34 Not Estab. %   Monocytes 10 Not Estab. %   Eos 3 Not Estab. %   Basos 1 Not Estab. %   Neutrophils Absolute 9.2 (H) 1.4 - 7.0 x10E3/uL   Lymphocytes Absolute 6.2 (H) 0.7 - 3.1 x10E3/uL   Monocytes Absolute 1.7 (H) 0.1 - 0.9 x10E3/uL   EOS (ABSOLUTE) 0.5 (H) 0.0 - 0.4 x10E3/uL   Basophils Absolute 0.2 0.0 - 0.2 x10E3/uL   Immature Granulocytes 2 Not Estab. %   Immature Grans (Abs) 0.3 (H) 0.0 - 0.1 x10E3/uL       Pertinent labs & imaging results that were available during my care of the patient were reviewed by me and considered in my medical decision making.  Assessment & Plan:  There are no diagnoses linked to this encounter.   Assessment and Plan Patricia Jefferson is a 37 year old Caucasian female seen today for otitis media, no acute distress Assessment & Plan Acute bilateral otitis media Acute infection in both ears, more severe on the right, causing headache, toothache, and earache. - Prescribed Omnicef 300 mg, one tablet twice daily for ten days. - Instructed to complete antibiotics course even if symptoms improve. - Advised alternating Tylenol  and Advil  for pain. - Prescribed Debrox for ear cleaning post-antibiotics. - Instructed to avoid Q-tips inside ears. - Advised against using peroxide in ears.      Continue all other maintenance medications.  Follow up plan: No follow-ups on file.   Continue healthy lifestyle choices, including diet (rich in fruits, vegetables, and lean proteins, and low in salt and simple carbohydrates) and exercise (at least 30 minutes of moderate physical activity daily).  Educational handout given for    Clinical References  Ear Irrigation Ear irrigation is a procedure to wash dirt and wax out of your ear canal. It's also  called lavage. You may need this if you're having trouble hearing because of wax in your ear. You may also have it done as part of the treatment for an ear infection.  Getting wax and dirt out of your ear can help ear drops work better. Tell a health care provider about: Any allergies you have. All medicines you're taking, including vitamins, herbs, eye drops, creams, and over-the-counter medicines. Any problems you or family members have had with anesthesia. Any bleeding problems you have. Any surgeries you've had. This includes any ear surgeries. Any medical conditions you have, such as any problems with your ear. Whether you're pregnant or may be pregnant. What are the risks? Your health care provider will talk with you about risks. These may include: Infection. Pain. Loss of hearing. Fluid and debris being pushed into your middle ear. This can happen if there are holes in your eardrum. The procedure not working. Trauma to your ear. Feeling dizzy, light-headed, or nauseous. What happens before the procedure? You'll talk with your provider about the procedure and plan. You may be given ear drops to put in your ear 15-20 minutes before the procedure. This helps loosen the wax. What happens during the procedure?  A syringe will be filled with water or a saline solution. Saline is made of salt and water. The syringe will be gently put into your ear. The fluid will be used to wash out wax and other debris. The procedure may vary among providers and hospitals. What can I expect after  the procedure? Follow the instructions given to you by your provider. Follow these instructions at home: Using ear irrigation kits In some cases, you can use an ear irrigation kit at home. Ask your provider if this is an option for you. Use the kit only as told by your provider. Read the instructions on the package. Follow the directions for using the syringe. Use water that's at room temperature. Do  not use an ear irrigation kit if: You have diabetes. This can make you more likely to get an infection. You have a hole or tear in your eardrum. You have tubes in your ears. You've had ear surgery before. You've been told not to irrigate your ears. Cleaning your ears  Clean the outside of your ear with a soft washcloth each day. If told by your provider, use a few drops of baby oil, mineral oil, glycerin , hydrogen peroxide, or earwax softening drops. Do not use cotton swabs to clean your ears. These can push wax down into the ear canal. Do not put things into your ears to try to get rid of wax. This includes ear candles. General instructions Take over-the-counter and prescription medicines only as told by your provider. If you were prescribed antibiotics, use them as told by your provider. Do not stop using the antibiotic even if you start to feel better. Keep your ear clean and dry as told by your provider. See your provider at least once a year to have your ears and hearing checked. Contact a health care provider if: Your hearing isn't getting better. Your hearing is getting worse. You have pain or redness in your ear. You feel dizzy. You have ringing in your ears. You have nausea or vomiting. This information is not intended to replace advice given to you by your health care provider. Make sure you discuss any questions you have with your health care provider. Document Revised: 07/22/2022 Document Reviewed: 07/22/2022 Elsevier Patient Education  2024 Elsevier Inc. Earwax Buildup, Adult Your ears make something called earwax. It helps keep germs called bacteria away and protects the skin in your ears. Sometimes, too much earwax can build up. This can cause discomfort or make it harder to hear. What are the causes? Earwax buildup can happen when you have too much earwax in your ears. Earwax is made in the outer part of your ear canal. It's supposed to fall out in small amounts over  time. But if your ears aren't able to clean themselves like they should, earwax can build up. What increases the risk? You're more likely to get earwax buildup if: You clean your ears with cotton swabs. You pick at your ears. You use earplugs or in-ear headphones a lot. You wear hearing aids. You may also be more likely to get it if: You're female. You're older. Your ears naturally make more earwax. You have narrow ear canals or extra hair in your ears. Your earwax is too thick or sticky. You have eczema. You're dehydrated. This means there's not enough fluid in your body. What are the signs or symptoms? Symptoms of earwax buildup include: Not being able to hear as well. A feeling of fullness in your ear. Feeling like your ear is plugged. Fluid coming from your ear. Ear pain or an itchy ear. Ringing in your ear. Coughing or problems with balance. How is this diagnosed? Earwax buildup may be diagnosed based on your symptoms, medical history, and an ear exam. During the exam, your health care provider will look into  your ear with a tool called an otoscope. You may also have tests, such as a hearing test. How is this treated? Earwax buildup may be treated by: Using ear drops. Having the earwax removed by a provider. The provider may: Flush the ear with water. Use a tool called a curette that has a loop on the end. Use a suction device. Having surgery. This may be done in severe cases. Follow these instructions at home:  Cleaning your ears Clean your ears as told by your provider. You can clean the outside of your ears with a washcloth or tissue. Do not overclean your ears. Do not put anything into your ear unless told. This includes cotton swabs. General instructions Take over-the-counter and prescription medicines only as told by your provider. Drink enough fluid to keep your pee (urine) pale yellow. This helps thin the earwax. If you have hearing aids, clean them as  told. Keep all follow-up visits. If earwax builds up in your ears often or if you use hearing aids, ask your provider how often you should have your ears cleaned. Contact a health care provider if: Your ear pain gets worse. You have a fever. You have pus, blood, or other fluid coming from your ear. You have hearing loss. You have ringing in your ears that won't go away. You feel like the room is spinning. This is called vertigo. Your symptoms don't get better with treatment. This information is not intended to replace advice given to you by your health care provider. Make sure you discuss any questions you have with your health care provider. Document Revised: 07/22/2022 Document Reviewed: 07/22/2022 Elsevier Patient Education  2024 Elsevier Inc. Otitis Media, Adult  Otitis media is a condition in which the middle ear is red and swollen (inflamed) and full of fluid. The middle ear is the part of the ear that contains bones for hearing as well as air that helps send sounds to the brain. The condition usually goes away on its own. What are the causes? This condition is caused by a blockage in the eustachian tube. This tube connects the middle ear to the back of the nose. It normally allows air into the middle ear. The blockage is caused by fluid or swelling. Problems that can cause blockage include: A cold or infection that affects the nose, mouth, or throat. Allergies. An irritant, such as tobacco smoke. Adenoids that have become large. The adenoids are soft tissue located in the back of the throat, behind the nose and the roof of the mouth. Growth or swelling in the upper part of the throat, just behind the nose (nasopharynx). Damage to the ear caused by a change in pressure. This is called barotrauma. What increases the risk? You are more likely to develop this condition if you: Smoke or are exposed to tobacco smoke. Have an opening in the roof of your mouth (cleft palate). Have acid  reflux. Have problems in your body's defense system (immune system). What are the signs or symptoms? Symptoms of this condition include: Ear pain. Fever. Problems with hearing. Being tired. Fluid leaking from the ear. Ringing in the ear. How is this treated? This condition can go away on its own within 3-5 days. But if the condition is caused by germs (bacteria) and does not go away on its own, or if it keeps coming back, your doctor may: Give you antibiotic medicines. Give you medicines for pain. Follow these instructions at home: Take over-the-counter and prescription medicines only as  told by your doctor. If you were prescribed an antibiotic medicine, take it as told by your doctor. Do not stop taking it even if you start to feel better. Keep all follow-up visits. Contact a doctor if: You have bleeding from your nose. There is a lump on your neck. You are not feeling better in 5 days. You feel worse instead of better. Get help right away if: You have pain that is not helped with medicine. You have swelling, redness, or pain around your ear. You get a stiff neck. You cannot move part of your face (paralysis). You notice that the bone behind your ear hurts when you touch it. You get a very bad headache. Summary Otitis media means that the middle ear is red, swollen, and full of fluid. This condition usually goes away on its own. If the problem does not go away, treatment may be needed. You may be given medicines to treat the infection or to treat your pain. If you were prescribed an antibiotic medicine, take it as told by your doctor. Do not stop taking it even if you start to feel better. Keep all follow-up visits. This information is not intended to replace advice given to you by your health care provider. Make sure you discuss any questions you have with your health care provider. Document Revised: 08/18/2020 Document Reviewed: 08/18/2020 Elsevier Patient Education  2024  Elsevier Inc.  The above assessment and management plan was discussed with the patient. The patient verbalized understanding of and has agreed to the management plan. Patient is aware to call the clinic if they develop any new symptoms or if symptoms persist or worsen. Patient is aware when to return to the clinic for a follow-up visit. Patient educated on when it is appropriate to go to the emergency department.   Scherry Laverne [place] Thompson, DNP [facility] [address] [place], [place] [address] [phone]

## 2024-03-08 NOTE — Telephone Encounter (Signed)
 FYI Only or Action Required?: FYI only for provider.  Patient was last seen in primary care on 11/03/2022 by Joesph Annabella HERO, FNP.  Called Nurse Triage reporting Ear Pain, Jaw Pain, and Dental Pain.  Symptoms began several days ago.  Interventions attempted: OTC medications: advil /tylenol .  Symptoms are: unchanged.  Triage Disposition: See Physician Within 24 Hours  Patient/caregiver understands and will follow disposition?: Yes         Copied from CRM [id number]. Topic: Clinical - Red Word Triage >> Mar 08, 2024 11:33 AM Willma SAUNDERS wrote: Red Word that prompted transfer to Nurse Triage: Patient states since Tuesday has been having ear, tooth and jaw pain. Reason for Disposition  Earache lasts > 1 hour  Answer Assessment - Initial Assessment Questions 1. LOCATION: Which ear is involved?       R side 2. SENSATION: Describe how the ear feels. (e.g., stuffy, full, plugged).      pain 3. ONSET:  When did the ear symptoms start?       Tuesday 4. PAIN: Do you also have an earache? If Yes, ask: How bad is it? (Scale 0-10; none, mild, moderate or severe)     7/10 Advil /tylenol  with minimal relief 5. CAUSE: What do you think is causing the ear congestion? (e.g., common cold, nasal allergies, recent flight, recent snorkeling)     Allergies, nasal congestion.  6. OTHER SYMPTOMS: Do you have any other symptoms? (e.g., ear drainage, hay fever symptoms such as sneezing or a clear nasal discharge; cold symptoms such as a cough or runny nose)     Tooth, jaw 7. PREGNANCY: Is there any chance you are pregnant? When was your last menstrual period?     N/a  Protocols used: Ear - Congestion-A-AH

## 2024-03-08 NOTE — Telephone Encounter (Signed)
 Apt scheduled.
